# Patient Record
Sex: Female | Born: 1985 | Race: White | Hispanic: Refuse to answer | Marital: Single
Health system: Northeastern US, Academic
[De-identification: ages and names within clinical notes are randomized; demographics above are authoritative.]

---

## 2016-03-06 ENCOUNTER — Ambulatory Visit: Admitting: Hand Surgery

## 2016-03-06 NOTE — Progress Notes (Signed)
* * *        **  [name], [name]**    --- ---    65 Y old Female, DOB: 1985-09-20    [address], [place], [place] [address]    Home: [phone]    Provider: [name]        * * *    Telephone Encounter    ---    Answered by   Archer Asa  Date: 03/06/2016         Time: 01:55 PM    Caller   [name]    --- ---            Reason   Second opinion            Message                      Good afternoon,      The patient is having surgery next week on both hands and would like to get a second opinion from Dr. Rodman Pickle before. Please call the patient back at [phone].            Thank you=)                Action Taken   Allen,Kara 03/06/2016 1:57:18 PM > [name],[name] 03/06/2016  2:48:01 PM > , Action - Pt telephoned. Spoke to pt.                * * *                ---          * * *          Patient: [name], [name] DOB: 1985-08-20 Provider: [name] 03/06/2016    ---    Note generated by eClinicalWorks EMR/PM Software ([email or web address])

## 2016-03-15 ENCOUNTER — Ambulatory Visit: Admitting: Hand Surgery

## 2016-03-15 ENCOUNTER — Ambulatory Visit: Admitting: Surgical

## 2016-03-15 NOTE — Progress Notes (Signed)
* * *        **Flood, Loriann**    --- ---    69 Y old Female, DOB: [birth year], External MRN: [id number]    Account Number: [id number]    [address] [place], [place]    Home: [phone]    Insurance: HMO BLUE OUT [employer]    PCP: Greg Cutter, MD Referring: Greg Cutter, MD    Appointment Facility: [facility]        * * *    03/15/2016   **Appointment Provider:** Herbert Deaner, [facility] **CHN#:** [phone]    --- ---      **Supervising Provider:** [name], MD    ---        Reason for Appointment    ---      1\. Scleroderma    ---      History of Present Illness    ---     _GENERAL_ :    The patient is a 30 year old female with past medical history significant for  scleroderma with painful subcutaneous calcium deposition in her digits and her  left elbow. She is treated currently with IVIG. With regards to her left  elbow, she has had excision of her calcium deposition approximately 4 years  ago with recurrence. She also notes excision of calcium deposition at her  right middle fingertip 2 times once about 1 year ago and previously about 4  years ago, again with recurrence. She also notes a painful deposition at her  right ring fingertip and her left small fingertip. At this point, her fingers  are her primary complaint that she would prefer to address first. She denies  any issues with healing in the past. She does do hyperbaric oxygen as well.  She also mentions a painful right dorsal wrist nodule.      Current Medications    ---    Taking     * IVIg 500 mg 1 tab Oral 2x a month    ---    * Vitamin B Complex - Tablet Orally     ---    * Vitamin C Tablet Oral     ---    * Vitamin D 2000 U 1 tab every day    ---    * WP Thyroid 32.5 MG Tablet 1 tablet on an empty stomach Orally Once a day    ---    * Medication List reviewed and reconciled with the patient    ---      Past Medical History    ---       Medical History Verified.Marland Kitchen        ---      Surgical History    ---      Hand  surgery-left elbow- rt hand calcium excision    ---      Family History    ---       : unknown    ---    No autoimmune.    ---      Social History    ---    Tobacco  history: Never smoked.    Marital Status  Single.    Work/Occupation: full-time Consulting civil engineer.    Alcohol  Yes but rarely.      Allergies    ---      Keflex: Allergy    ---    Cephalexin    ---      Hospitalization/Major Diagnostic Procedure    ---  No Hospitalization History.    ---      Review of Systems    ---     _ORT_ :    Eyes No. Ear, Nose Throat No. Digestion, Stomach, Bowel No. Bladder Problems  No. Bleeding Problems No. Numbness/Tingling No. Anxiety/Depression No.  Fever/Chills/Fatigue No. Chest Pain/Tightness/Palpitations No. Skin Rash No.  Dental Problems No. Joint/Muscle Pain/Cramps Yes. Blackout/Fainting No. Other  No.          Vital Signs    ---    Pain scale 10, Ht-in 5'5, Wt-lbs 130.      Examination    ---     _GENERAL_ :    On examination today, she is a well-appearing pleasant female in no apparent  distress. On examination of her left elbow, she has obvious deformity noted.  She has range of motion from approximately negative 35 degrees full extension  with near full flexion and full rotation. With regards to her hand, she has  limited digital range of motion both lacking full extension as well as full  flexion. She has relatively well maintained MP range of motion. She has a  large calcium deposit at the tip of her right middle finger and clinically the  nail appears curved. She has smaller deposits evident at the tip of the right  ring finger and the left small finger. She also has a nodule on the dorsal  ulnar aspect of her right wrist.    RADIOGRAPHS: Radiographs that the patient brought with her of her bilateral  hands are reviewed by [place] today. These are dated 03/2016. We also obtained an  x-ray of her right middle finger today. These demonstrate a large calcium  deposit at the tip of the right middle finger with minimal bone  remaining  supporting the nail. She also has a deposition at the level of her volar PIP  joint of the right middle finger. There is smaller calcium depositions at the  tip of her left small finger and right ring finger as well as her right dorsal  wrist.          Assessments    ---    1\. Scleroderma - M34.9 (Primary)    ---      Treatment    ---       **1\. Others**    Notes: The patient was seen today with Dr. Rodman Pickle. Again, she is interested  in excision of calcium deposition. 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 She also would like to think about possible distal  amputation of her right middle finger. She will meet with Chiquita Loth today  about the possibility of prosthesis for her right middle finger. She will then  make a decision as to whether she would like to proceed with amputation versus  excision of calcification. She will be scheduled for surgery at her  convenience. The risks and benefits of surgery were discussed.    ---      Follow Up    ---    In OR    **Appointment Provider:** Herbert Deaner, [facility]    Electronically signed by [name] , MD on 10/03/2016 at 06:17 PM EDT    Sign off status: Completed        * * *        [facility]    [address]  [place], [place] [address]    Tel: [phone]    Fax: [phone]              * * *          Patient: EMILIE, CARP DOB: 1986-02-01 Progress Note: Herbert Deaner, [facility]  03/15/2016    ---    Note generated by eClinicalWorks EMR/PM Software ([email or web address])

## 2016-03-15 NOTE — Progress Notes (Signed)
.  Progress Notes  .  Patient: [name]  Provider: Herbert Deaner    .  DOB: [birth year] Age: 30 Y Sex: Female  Supervising Provider:: [name], MD  Date: 03/15/2016  .  PCP: Greg Cutter MD  Date: 03/15/2016  .  --------------------------------------------------------------------------------  .  REASON FOR APPOINTMENT  .  1. Scleroderma  .  HISTORY OF PRESENT ILLNESS  .  GENERAL:  The patient is a 30 year old female with  past medical history significant for scleroderma with painful  subcutaneous calcium deposition in her digits and her left elbow.  She is treated currently with IVIG. With regards to her left  elbow, she has had excision of her calcium deposition  approximately 4 years ago with recurrence. She also notes  excision of calcium deposition at her right middle fingertip 2  times once about 1 year ago and previously about 4 years ago,  again with recurrence. She also notes a painful deposition at her  right ring fingertip and her left small fingertip. At this point,  her fingers are her primary complaint that she would prefer to  address first. She denies any issues with healing in the past.  She does do hyperbaric oxygen as well. She also mentions a  painful right dorsal wrist nodule.  Marland Kitchen  CURRENT MEDICATIONS  .  Taking IVIg 500 mg 1 tab Oral 2x a month  Taking Vitamin B Complex - Tablet Orally  Taking Vitamin C Tablet Oral  Taking Vitamin D 2000 U 1 tab every day  Taking [facility] Thyroid 32.5 MG Tablet 1 tablet on an empty stomach  Orally Once a day  Medication List reviewed and reconciled with the patient  .  PAST MEDICAL HISTORY  .  Medical History Verified.  .  ALLERGIES  .  Keflex: Allergy  Cephalexin  .  SURGICAL HISTORY  .  Hand surgery-left elbow- rt hand calcium excision  .  FAMILY HISTORY  .  : unknown  No autoimmune.  .  SOCIAL HISTORY  .  .  Tobaccohistory:Never smoked.  .  Marital Status Single.  .  Work/Occupation: full-time Consulting civil engineer.  .  Alcohol Yes but  rarely.  Marland Kitchen  HOSPITALIZATION/MAJOR DIAGNOSTIC PROCEDURE  .  No Hospitalization History.  Marland Kitchen  REVIEW OF SYSTEMS  .  ORT:  .  Eyes    No . Ear, Nose Throat    No . Digestion, Stomach, Bowel     No . Bladder Problems    No . Bleeding Problems    No .  Numbness/Tingling    No . Anxiety/Depression    No .  Fever/Chills/Fatigue    No . Chest Pain/Tightness/Palpitations     No . Skin Rash    No . Dental Problems    No . Joint/Muscle  Pain/Cramps    Yes . Blackout/Fainting    No . Other    No .  .  VITAL SIGNS  .  Pain scale 10, Ht-in 5'5, Wt-lbs 130.  Marland Kitchen  EXAMINATION  .  GENERAL: On examination today, she is a  well-appearing pleasant female in no apparent distress. On  examination of her left elbow, she has obvious deformity noted.  She has range of motion from approximately negative 35 degrees  full extension with near full flexion and full rotation. With  regards to her hand, she has limited digital range of motion both  lacking full extension as well as full flexion. She has  relatively well maintained MP range of  motion. She has a large  calcium deposit at the tip of her right middle finger and  clinically the nail appears curved. She has smaller deposits  evident at the tip of the right ring finger and the left small  finger. She also has a nodule on the dorsal ulnar aspect of her  right wrist.RADIOGRAPHS: Radiographs that the patient brought  with her of her bilateral hands are reviewed by [place] today. These  are dated 03/2016. We also obtained an x-ray of her right middle  finger today. These demonstrate a large calcium deposit at the  tip of the right middle finger with minimal bone remaining  supporting the nail. She also has a deposition at the level of  her volar PIP joint of the right middle finger. There is smaller  calcium depositions at the tip of her left small finger and right  ring finger as well as her right dorsal wrist.  .  ASSESSMENTS  .  Scleroderma - M34.9 (Primary)  .  TREATMENT  .  Others  Notes: The  patient was seen today with Dr. Rodman Pickle. Again, she is  interested in excision of calcium deposition. 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 She also  would like to think about possible distal amputation of her right  middle finger. She will meet with Chiquita Loth today about the  possibility of prosthesis for her right middle finger. She will  then make a decision as to whether she would like to proceed with  amputation versus excision of calcification. She will be  scheduled for surgery at her convenience. The risks and benefits  of surgery were discussed.  .  FOLLOW UP  .  In OR  .  Marland Kitchen  Appointment Provider: Herbert Deaner, [facility]  .  Electronically signed by [name] , MD on  10/03/2016 at 06:17 PM EDT  .  CONFIRMATORY SIGN OFF  .  Marland Kitchen  Document electronically signed by Herbert Deaner    .

## 2016-03-22 ENCOUNTER — Ambulatory Visit

## 2016-03-22 ENCOUNTER — Ambulatory Visit: Admitting: Hand Surgery

## 2016-03-22 NOTE — Progress Notes (Signed)
* * *        **[name], [name]**    --- ---    26 Y old Female, DOB: 22-Jul-1985, External MRN: [id number]    Account Number: [id number]    [address] [place], [place]    Home: [phone]    Insurance: HMO BLUE OUT [employer]    PCP: Greg Cutter, MD Referring: Greg Cutter, MD    Appointment Facility: [facility]        * * *    03/22/2016   **Appointment Provider:** Crist Infante **CHN#:** [id number]    --- ---      **Supervising Provider:** [name], MD    ---        Reason for Appointment    ---      1\. Scleroderma    ---      History of Present Illness    ---     _GENERAL_ :    The patient is a 30 year old female with past medical history significant for  scleroderma with painful subcutaneous calcium deposition in her digits and her  left elbow. She is here to discuss the imaging results before her planned  surgery 12/28.      Current Medications    ---    Taking     * IVIg 800 mg infusion Oral 2x a month    ---    * Vitamin B Complex - Tablet Orally     ---    * Vitamin C Tablet Oral     ---    * Vitamin D 2000 U 1 tab every day    ---    * WP Thyroid 32.5 MG Tablet 1 tablet on an empty stomach Orally Once a day    ---      Past Medical History    ---       Medical History Verified.Marland Kitchen        ---      Surgical History    ---      Hand surgery-left elbow- rt hand calcium excision    ---      Family History    ---       : unknown    ---    No autoimmune.    ---      Social History    ---    Tobacco    history: _Never smoked_    Marital Status    _Single_    Work/Occupation: full-time Consulting civil engineer.    Alcohol    _Yes but rarely_       Allergies    ---      Keflex: Allergy    ---    Cephalexin    ---      Hospitalization/Major Diagnostic Procedure    ---      No Hospitalization History.    ---      Review of Systems    ---     _ORT_ :    Eyes No. Ear, Nose Throat No. Digestion, Stomach, Bowel No. Bladder Problems  No. Bleeding Problems No. Numbness/Tingling No.  Anxiety/Depression No.  Fever/Chills/Fatigue No. Chest Pain/Tightness/Palpitations No. Skin Rash No.  Dental Problems No. Joint/Muscle Pain/Cramps Yes. Blackout/Fainting No. Other  No.          Vital Signs    ---    Pain scale 10, Ht-in 5'5.      Examination    ---     _GENERAL_ :  On examination today, she is a well-appearing pleasant female in no apparent  distress. On examination of her left elbow, she has obvious deformity noted.  She has range of motion from approximately negative 35 degrees full extension  with near full flexion and full rotation. With regards to her hand, she has  limited digital range of motion both lacking full extension as well as full  flexion. She has relatively well maintained MP range of motion. She has a  large calcium deposit at the tip of her right middle finger and clinically the  nail appears curved. She has smaller deposits evident at the tip of the right  ring finger and the left small finger. She also has a nodule on the dorsal  ulnar aspect of her right wrist.    RADIOGRAPHS: Radiographs that the patient brought with her of her bilateral  hands are reviewed by [place] today. These are dated 03/2016. We also obtained an  x-ray of her right middle finger today. These demonstrate a large calcium  deposit at the tip of the right middle finger with minimal bone remaining  supporting the nail. She also has a deposition at the level of her volar PIP  joint of the right middle finger. There is smaller calcium depositions at the  tip of her left small finger and right ring finger as well as her right dorsal  wrist.          Assessments    ---    1\. Scleroderma - M34.9 (Primary)    ---      Treatment    ---       **1\. Others**    Notes: 30yo planned for excision of calcific masses of R MF/RF, dorsal wrist  and L elbow. Dr. Rodman Pickle has explained treatment options and she wants to  proceed with excision of 3 masses on R hand: R MF/RF and dorsal wrist. We  discussed that she could undergo  MF DIP fusion after she heals from this  surgery, likely 6 months. She wants tramadol post op.    ---      Follow Up    ---    post op    **Appointment Provider:** [facility]    Electronically signed by [name] , MD on 03/23/2016 at 11:09 PM EST    Sign off status: Completed        * * *        [facility]    [address]    [place], [place] [address]    Tel: [phone]    Fax: [phone]              * * *          Patient: [name], [name] DOB: [birth year] Progress Note: [name]  03/22/2016    ---    Note generated by eClinicalWorks EMR/PM Software ([email or web address])

## 2016-03-22 NOTE — Progress Notes (Signed)
.  Progress Notes  .  Patient: [name]  Provider: Crist Infante  MD  .  DOB: [birth year] Age: 30 Y Sex: Female  Supervising Provider:: [name], MD  Date: 03/22/2016  .  PCP: Greg Cutter MD  Date: 03/22/2016  .  --------------------------------------------------------------------------------  .  REASON FOR APPOINTMENT  .  1. Scleroderma  .  HISTORY OF PRESENT ILLNESS  .  GENERAL:  The patient is a 30 year old female with  past medical history significant for scleroderma with painful  subcutaneous calcium deposition in her digits and her left elbow.  She is here to discuss the imaging results before her planned  surgery 12/28.  Marland Kitchen  CURRENT MEDICATIONS  .  Taking IVIg 800 mg infusion Oral 2x a month  Taking Vitamin B Complex - Tablet Orally  Taking Vitamin C Tablet Oral  Taking Vitamin D 2000 U 1 tab every day  Taking [facility] Thyroid 32.5 MG Tablet 1 tablet on an empty stomach  Orally Once a day  .  PAST MEDICAL HISTORY  .  Medical History Verified.  .  ALLERGIES  .  Keflex: Allergy  Cephalexin  .  SURGICAL HISTORY  .  Hand surgery-left elbow- rt hand calcium excision  .  FAMILY HISTORY  .  : unknown  No autoimmune.  .  SOCIAL HISTORY  .  .  Tobacco  history:Never smoked  .  Marland Kitchen  Marital Status  Single  .  Marland Kitchen  Work/Occupation: full-time Consulting civil engineer.  .  .  Alcohol  Yes but rarely  .  HOSPITALIZATION/MAJOR DIAGNOSTIC PROCEDURE  .  No Hospitalization History.  Marland Kitchen  REVIEW OF SYSTEMS  .  ORT:  .  Eyes    No . Ear, Nose Throat    No . Digestion, Stomach, Bowel     No . Bladder Problems    No . Bleeding Problems    No .  Numbness/Tingling    No . Anxiety/Depression    No .  Fever/Chills/Fatigue    No . Chest Pain/Tightness/Palpitations     No . Skin Rash    No . Dental Problems    No . Joint/Muscle  Pain/Cramps    Yes . Blackout/Fainting    No . Other    No .  .  VITAL SIGNS  .  Pain scale 10, Ht-in 5'5.  Marland Kitchen  EXAMINATION  .  GENERAL: On examination today, she is a  well-appearing pleasant female in no apparent  distress. On  examination of her left elbow, she has obvious deformity noted.  She has range of motion from approximately negative 35 degrees  full extension with near full flexion and full rotation. With  regards to her hand, she has limited digital range of motion both  lacking full extension as well as full flexion. She has  relatively well maintained MP range of motion. She has a large  calcium deposit at the tip of her right middle finger and  clinically the nail appears curved. She has smaller deposits  evident at the tip of the right ring finger and the left small  finger. She also has a nodule on the dorsal ulnar aspect of her  right wrist.RADIOGRAPHS: Radiographs that the patient brought  with her of her bilateral hands are reviewed by [place] today. These  are dated 03/2016. We also obtained an x-ray of her right middle  finger today. These demonstrate a large calcium deposit at the  tip of the right middle finger with minimal  bone remaining  supporting the nail. She also has a deposition at the level of  her volar PIP joint of the right middle finger. There is smaller  calcium depositions at the tip of her left small finger and right  ring finger as well as her right dorsal wrist.  .  ASSESSMENTS  .  Scleroderma - M34.9 (Primary)  .  TREATMENT  .  Others  Notes: 30yo planned for excision of calcific masses of R MF/RF,  dorsal wrist and L elbow. Dr. Rodman Pickle has explained treatment  options and she wants to proceed with excision of 3 masses on R  hand: R MF/RF and dorsal wrist. We discussed that she could  undergo MF DIP fusion after she heals from this surgery, likely 6  months. She wants tramadol post op.  .  FOLLOW UP  .  post op  .  Marland Kitchen  Appointment Provider: Crist Infante  .  Electronically signed by [name] , MD on  03/23/2016 at 11:09 PM EST  .  CONFIRMATORY SIGN OFF  .  Marland Kitchen  Document electronically signed by Crist Infante  MD  .

## 2016-03-28 ENCOUNTER — Ambulatory Visit: Admitting: Hand Surgery

## 2016-03-28 LAB — HX BF-URINALYSIS: HX URINE PREGNANCY TEST (HCG QUAL): NEGATIVE

## 2016-08-15 ENCOUNTER — Ambulatory Visit: Admitting: Hand Surgery

## 2016-08-15 NOTE — Progress Notes (Signed)
* * *        **  [name], [name]**    --- ---    41 Y old Female, DOB: Aug 20, 1985    [address], [place], [place] [address]    Home: [phone]    Provider: [name]        * * *    Telephone Encounter    ---    Answered by   Park Liter  Date: 08/15/2016         Time: 02:11 PM    Caller   Ceclia    --- ---            Reason   Call Back            Message                      Hi -      Patient is requesting a call back from Dr. Delsa Grana coordinator because she has a few questions before scheduling an appointment. Please call back at [phone].            Thank you.                Action Taken   [facility] 08/15/2016 2:14:11 PM > I called [name] back. I  left her a VM to call the office back. [name],[name] 08/19/2016 2:33:24 PM >                * * *                ---          * * *          Patient: [name], [name] DOB: May 24, 1985 Provider: [name] 08/15/2016    ---    Note generated by eClinicalWorks EMR/PM Software ([email or web address])

## 2016-09-26 ENCOUNTER — Ambulatory Visit: Admitting: Hand Surgery

## 2016-09-26 NOTE — Progress Notes (Signed)
* * *        **  [name], [name]**    --- ---    48 Y old Female, DOB: 10-Oct-1985    [address] [place], [place] [address]    Home: [phone]    Provider: [name]        * * *    Telephone Encounter    ---    Answered by   Archer Asa  Date: 09/26/2016         Time: 10:15 AM    Caller   Adriene    --- ---            Reason   Call back            Message                      Good morning,      The patient is following up regarding Dr. Rodman Pickle doing  surgery on her elbow and rt hand. Please call the patient back at [phone]                      Action Taken   Allen,Kara 09/26/2016 10:15:41 AM > [name],[name] 09/30/2016  9:08:03 AM > , Action - pt telephoned. Left message Douzable,Cafiane 09/30/2016  10:00:12 AM > Patient is requesting a call back. Please contact patient at  phone:[phone]. Thank you She called back [name],[name] 09/30/2016  1:07:33 PM > [name],[name] 10/01/2016 9:00:40 AM > Called back. Message on  recording says "person can not accept calls at this time. Unable to leave  message.                * * *                ---          * * *          Patient: [name], [name] DOB: 1985/11/25 Provider: [name] 09/26/2016    ---    Note generated by eClinicalWorks EMR/PM Software ([email or web address])

## 2017-02-24 ENCOUNTER — Ambulatory Visit: Admitting: Hand Surgery

## 2017-02-24 ENCOUNTER — Ambulatory Visit: Admitting: Orthopaedic Surgery

## 2017-02-24 NOTE — Progress Notes (Signed)
.  Progress Notes  .  Patient: [name], [name]  Provider: Dorian Pod    .  DOB: [birth year] Age: 31 Y Sex: Female  Supervising Provider:: [name], MD  Date: 02/24/2017  .  PCP: Greg Cutter MD  Date: 02/24/2017  .  --------------------------------------------------------------------------------  .  REASON FOR APPOINTMENT  .  1. LT ELBOW WOUND FU  .  HISTORY OF PRESENT ILLNESS  .  GENERAL:   Ms. Kalmar is a pleasant 31 year old female with past medical  history of scleroderma with painful subcutaneous calcium  deposition of her left elbow, presents for evaluation of a left  elbow wound. She reports while in [place], a subcutaneous calcium  deposition broke through the skin and had surgery done to remove  the calcium deposition and wound closure. She believes the  sutures were removed prematurely causing the wound to open. New  sutures were placed, however, it did not fully close the wound.  She reports of continued mild drainage from the wound, as  serosanguineous in nature rather than purulent discharge. She  denies fevers. She endorses numbness and paresthesias of the left  hand more noticeably on the middle finger. She denies trauma or  any other issues at this time.  .  CURRENT MEDICATIONS  .  Taking IVIg 800 mg infusion Oral 2x a month  Taking Vitamin B Complex - Tablet Orally  Taking Vitamin C Tablet Oral  Taking Vitamin D 2000 U 1 tab every day  Taking [facility] Thyroid 32.5 MG Tablet 1 tablet on an empty stomach  Orally Once a day  Medication List reviewed and reconciled with the patient  .  PAST MEDICAL HISTORY  .  Medical History Verified.  .  ALLERGIES  .  Keflex: Allergy  Cephalexin  .  SURGICAL HISTORY  .  Hand surgery-left elbow- rt hand calcium excision  .  FAMILY HISTORY  .  : unknown  No autoimmune.  .  SOCIAL HISTORY  .  .  Tobaccohistory:Never smoked.  .  Marital Status Single.  .  Work/Occupation: full-time Consulting civil engineer.  .  Alcohol Yes but rarely.  Marland Kitchen  HOSPITALIZATION/MAJOR DIAGNOSTIC  PROCEDURE  .  No Hospitalization History.  Marland Kitchen  REVIEW OF SYSTEMS  .  ORT:  .  Eyes    No . Ear, Nose Throat    No . Digestion, Stomach, Bowel     No . Bladder Problems    No . Bleeding Problems    No .  Numbness/Tingling    No . Anxiety/Depression    No .  Fever/Chills/Fatigue    No . Chest Pain/Tightness/Palpitations     No . Skin Rash    No . Dental Problems    No . Joint/Muscle  Pain/Cramps    Yes . Blackout/Fainting    No . Other    No .  .  VITAL SIGNS  .  Pain scale 7, Ht-in 5'5, Ht-cm 152.4.  .  PHYSICAL EXAMINATION  .  30yo female, well-appearing, in no acute distress. On Examination  of the left elbow reveals skin iindurated. There is a 2cm wound  posterolateral aspect of the elbow, with signs of minimal  drainage. There is NO erythema, edema. There is subcuteneous  calcification surrounging the wound. Mild Tenderness surrounding  the wound. ROM: lacks 30 deg of full extension, has full flexion,  able to pronosupinate. Positive Tinels and wrist compression  tests. Fires EDC/FDS/FDP/EPL/FPL. Neurologic sensation intact to  light touch in M/U/R distributions. Hand is warm  and well  perfused with palpable radial pulse.IMAGING: Xrays of left elbow  reviewed by [place] today reveal no acute fracture or dislocation.  There is progressive clustered calcification deposition posterior  of elbow.  .  ASSESSMENTS  .  Open wound of left elbow, subsequent encounter - S51.002D  (Primary)  .  Scleroderma - M34.9 (Primary)  .  TREATMENT  .  Open wound of left elbow, subsequent encounter  Notes: The patient was seen and evaluated with Dr. Rodman Pickle. A  31 year old female presents with open surgical wound with minimal  discharge secondary to excision of a subcutaneous calcium  deposition. On exam, there is a 2 cm wound with minimal clear  discharge and imaging revealed large amount of calcium deposits  most noticeably on the posterior aspect of the elbow. Findings,  diagnosis, and nature of the conditions were discussed with  the  patient again. We also had a long discussion regarding treatment  options including conservative and more aggressive measurements.  We recommend surgical intervention with a radical calcium  deposition excision. After discussing risks, benefits, and  alternatives answering all questions, the patient was agreeable  to proceed with surgery. Risks include, but were not limited to  infection, pain, stiffness, neurovascular injury, recurrence,  wound healing problems, and the need for further surgery. The  anticipated postoperative rehabilitation was reviewed with the  patient as well. Our administrator will meet with the patient to  schedule a surgical date. She voices agreement with the  assessment and plan. Questions were answered.  .  FOLLOW UP  .  surgery  .  Marland Kitchen  Appointment Provider: Dorian Pod  .  Electronically signed by [name] , MD on  03/16/2017 at 06:58 PM EST  .  CONFIRMATORY SIGN OFF  .  Marland Kitchen  Document electronically signed by Dorian Pod    .

## 2017-02-24 NOTE — Progress Notes (Signed)
* * *        **Lundstrom, [name]**    --- ---    61 Y old Female, DOB: 04-07-1985, External MRN: [id number]    Account Number: [id number]    [address] [id number]    Home: [phone]    Insurance: HMO BLUE OUT IPA    PCP: Greg Cutter, MD Referring: Greg Cutter, MD    [facility]        * * *    02/24/2017   **Appointment Provider:** Dorian Pod **CHN#:** [id number]    --- ---      **Supervising Provider:** [name], MD    ---        Reason for Appointment    ---      1\. LT ELBOW WOUND FU    ---      History of Present Illness    ---     _GENERAL_ :    Ms. Soberanes is a pleasant 31 year old female with past medical history of  scleroderma with painful subcutaneous calcium deposition of her left elbow,  presents for evaluation of a left elbow wound. She reports while in [place], a  subcutaneous calcium deposition broke through the skin and had surgery done to  remove the calcium deposition and wound closure. She believes the sutures were  removed prematurely causing the wound to open. New sutures were placed,  however, it did not fully close the wound. She reports of continued mild  drainage from the wound, as serosanguineous in nature rather than purulent  discharge. She denies fevers. She endorses numbness and paresthesias of the  left hand more noticeably on the middle finger. She denies trauma or any other  issues at this time.      Current Medications    ---    Taking     * IVIg 800 mg infusion Oral 2x a month    ---    * Vitamin B Complex - Tablet Orally     ---    * Vitamin C Tablet Oral     ---    * Vitamin D 2000 U 1 tab every day    ---    * WP Thyroid 32.5 MG Tablet 1 tablet on an empty stomach Orally Once a day    ---    * Medication List reviewed and reconciled with the patient    ---      Past Medical History    ---       Medical History Verified.Marland Kitchen        ---      Surgical History    ---      Hand surgery-left elbow- rt hand  calcium excision    ---      Family History    ---       : unknown    ---    No autoimmune.    ---      Social History    ---    Tobacco  history: Never smoked.    Marital Status  Single.    Work/Occupation: full-time Consulting civil engineer.    Alcohol  Yes but rarely.      Allergies    ---      Keflex: Allergy    ---    Cephalexin    ---      Hospitalization/Major Diagnostic Procedure    ---      No Hospitalization History.    ---  Review of Systems    ---     _ORT_ :    Eyes No. Ear, Nose Throat No. Digestion, Stomach, Bowel No. Bladder Problems  No. Bleeding Problems No. Numbness/Tingling No. Anxiety/Depression No.  Fever/Chills/Fatigue No. Chest Pain/Tightness/Palpitations No. Skin Rash No.  Dental Problems No. Joint/Muscle Pain/Cramps Yes. Blackout/Fainting No. Other  No.          Vital Signs    ---    Pain scale 7, Ht-in 5'5, Ht-cm 152.4.      Physical Examination    ---    30yo female, well-appearing, in no acute distress. On Examination of the left  elbow reveals skin iindurated. There is a 2cm wound posterolateral aspect of  the elbow, with signs of minimal drainage. There is NO erythema, edema. There  is subcuteneous calcification surrounging the wound. Mild Tenderness  surrounding the wound. ROM: lacks 30 deg of full extension, has full flexion,  able to pronosupinate. Positive Tinels and wrist compression tests. Fires  EDC/FDS/FDP/EPL/FPL. Neurologic sensation intact to light touch in M/U/R  distributions. Hand is warm and well perfused with palpable radial pulse.    IMAGING: Xrays of left elbow reviewed by [place] today reveal no acute fracture or  dislocation. There is progressive clustered calcification deposition posterior  of elbow.      Assessments    ---    1\. Open wound of left elbow, subsequent encounter - S51.002D (Primary)    ---    2\. Scleroderma - M34.9 (Primary)    ---      Treatment    ---       **1\. Open wound of left elbow, subsequent encounter**    Notes: The patient was seen and evaluated with Dr.  Rodman Pickle. A 31 year old  female presents with open surgical wound with minimal discharge secondary to  excision of a subcutaneous calcium deposition. On exam, there is a 2 cm wound  with minimal clear discharge and imaging revealed large amount of calcium  deposits most noticeably on the posterior aspect of the elbow. Findings,  diagnosis, and nature of the conditions were discussed with the patient again.  We also had a long discussion regarding treatment options including  conservative and more aggressive measurements. We recommend surgical  intervention with a radical calcium deposition excision. After discussing  risks, benefits, and alternatives answering all questions, the patient was  agreeable to proceed with surgery. Risks include, but were not limited to  infection, pain, stiffness, neurovascular injury, recurrence, wound healing  problems, and the need for further surgery. The anticipated postoperative  rehabilitation was reviewed with the patient as well. Our administrator will  meet with the patient to schedule a surgical date. She voices agreement with  the assessment and plan. Questions were answered.    ---      Follow Up    ---    surgery    **Appointment Provider:** Dorian Pod    Electronically signed by [name] , MD on 03/16/2017 at 06:58 PM EST    Sign off status: Completed        * * *        [facility]    [address]    [place], [place] [address]    Tel: [phone]    Fax: [phone]              * * *          Patient: AIRIS, BARBEE DOB: [birth year] Progress Note: Dorian Pod  02/24/2017    ---    Note generated by eClinicalWorks EMR/PM Software ([email or web address])

## 2017-04-30 LAB — BMP (EXT)
BUN (EXT): 7 mg/dL (ref 7–25)
CO2 (EXT): 23 mmol/L (ref 21–33)
Chloride (EXT): 104 mmol/L (ref 98–110)
Creatinine (EXT): 0.7 mg/dL (ref 0.50–1.20)
Glucose (EXT): 89 mg/dL (ref 60–99)
Potassium (EXT): 4 mmol/L (ref 3.3–5.3)
Sodium (EXT): 142 mmol/L (ref 134–146)
eGFR - Creat MDRD (EXT): >60 (ref >60)
eGFR - Creat MDRD (EXT): >60 (ref >60)

## 2017-04-30 LAB — UNMAPPED LAB RESULTS: CalciumCalcium (EXT): 9.2 mg/dL (ref 8.8–10.6)

## 2017-05-13 ENCOUNTER — Ambulatory Visit: Admitting: Hand Surgery

## 2017-05-13 NOTE — Progress Notes (Signed)
* * *        **  [name], [name]**    --- ---    12 Y old Female, DOB: October 20, 1985    [address], [place], [place] [address]    Home: [phone]    Provider: [name]        * * *    Telephone Encounter    ---    Answered by   Gwynne Edinger  Date: 05/13/2017         Time: 02:48 PM    Caller   Patient    --- ---            Reason   Email-Callback            Message                      Hello,            Pt would like to know if she can have work done on her elbow and her hand at the same time instead of coming in for 2 seperate surgerys. Please email the pt back [email or web address] or contact at the number on file.            Thank you.                Action Taken                      Crocker,Anthony  05/13/2017 2:51:09 PM >                     * * *                ---          * * *          Patient: [name], [name] DOB: Sep 26, 1985 Provider: [name] 05/13/2017    ---    Note generated by eClinicalWorks EMR/PM Software ([email or web address])

## 2017-05-22 ENCOUNTER — Ambulatory Visit: Admitting: Hand Surgery

## 2017-05-22 NOTE — Progress Notes (Signed)
* * *        **  [name], [name]**    --- ---    54 Y old Female, DOB: 06-Sep-1985    [address], [place], [place] [address]    Home: [phone]    Provider: [name]        * * *    Telephone Encounter    ---    Answered by   Gwynne Edinger  Date: 05/22/2017         Time: 10:21 AM    Caller   Patient    --- ---            Reason   Callback            Message                      Hello,            Pt has her CT scans available so she would like to schedule her surgery or at least see the doctor to see what he thinks about the scans but the pt cannot wait till the end of March to see the doctor. Please contact back at [phone].            Thank you.                Action Taken                      Crocker,Anthony  05/22/2017 10:25:09 AM >       [name],[name]  05/23/2017 10:24:08 AM > , Action - pt telephoned.  Left message      [name],[name]  05/26/2017 1:16:02 PM > , Action - Pt telephoned.  Spoke to pt. Scheduled appointment for 06/04/17                    * * *                ---          * * *          Patient: [name], [name] DOB: 01-Sep-1985 Provider: [name] 05/22/2017    ---    Note generated by eClinicalWorks EMR/PM Software ([email or web address])

## 2017-06-04 ENCOUNTER — Ambulatory Visit: Admitting: Hand Surgery

## 2017-06-04 NOTE — Progress Notes (Signed)
.  Progress Notes  .  Patient: [name], [name]  Provider: [name]    .  DOB: 11/05/1985 Age: 32 Y Sex: Female  .  PCP: Ihor Gully T MD  Date: 06/04/2017  .  --------------------------------------------------------------------------------  .  REASON FOR APPOINTMENT  .  1. Left elbow contracture, left hand cold  .  HISTORY OF PRESENT ILLNESS  .  GENERAL:   follow-up. she postoponed her surgery. the elbow wound is  smaller and has not gotten infected, but continues to drain a  bit. She would like to get the wound to heal and to improve her  extnesion. she also has had episodes of fingers becoming white  and cold intermittently. She saw Dr. Garnette Czech at the [facility] and  was apparently told that there was nothing wrong.  Marland Kitchen  CURRENT MEDICATIONS  .  Taking IVIg 800 mg infusion Oral 2x a month  Taking Vitamin B Complex - Tablet Orally  Taking Vitamin C Tablet Oral  Taking Vitamin D 2000 U 1 tab every day  Taking [facility] Thyroid 32.5 MG Tablet 1 tablet on an empty stomach  Orally Once a day  Medication List reviewed and reconciled with the patient  .  PAST MEDICAL HISTORY  .  Medical History Verified.  .  ALLERGIES  .  Keflex: Allergy  Cephalexin  .  SURGICAL HISTORY  .  Hand surgery-left elbow- rt hand calcium excision  .  FAMILY HISTORY  .  : unknown  No autoimmune.  .  SOCIAL HISTORY  .  .  Tobaccohistory:Never smoked.  .  Marital Status Single.  .  Work/Occupation: full-time Consulting civil engineer.  .  Alcohol Yes but rarely.  Marland Kitchen  HOSPITALIZATION/MAJOR DIAGNOSTIC PROCEDURE  .  No Hospitalization History.  Marland Kitchen  REVIEW OF SYSTEMS  .  ORT:  .  Eyes    No . Ear, Nose Throat    No . Digestion, Stomach, Bowel     No . Bladder Problems    No . Bleeding Problems    No .  Numbness/Tingling    No . Anxiety/Depression    No .  Fever/Chills/Fatigue    No . Chest Pain/Tightness/Palpitations     No . Skin Rash    No . Dental Problems    No . Joint/Muscle  Pain/Cramps    Yes . Blackout/Fainting    No . Other    No .  .  VITAL SIGNS  .  Pain scale  5, Ht-in 5'5, Ht-cm 152.4.  .  PHYSICAL EXAMINATION  .  she appears healthy. examination of her left elbow demonstrates a  2 cm linear wound with a white base along the old posterolateral  scar. no erythema. AROM=PROM -30/140, full rotation. the cubital  tunnel is somewhat obliterated. sensation intact to light  touch.somewhat delayed capillary refill in digits. Doppler  unavailable. radial pulse palpable. digital motion limited. skin  intact. CT of left elbow reviewed by me demonstrates extensive  calcium deposits around posterior aspect of elbow not extneding  anteriorly. also separate deposit along ulna more distally at  site of nodule. some obiteration of cubital tunnel.  .  ASSESSMENTS  .  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  Scleroderma - M34.9  .  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  TREATMENT  .  Contracture of left elbow  Notes: nature of left elbow problem reviewed with her. I do not  think that wound will heal without removal of the calcium  deposits, which are extensive. I explained that I would favor  radical resection of the calcium via open posterior approach,  which will lallow for closure of the wound. I explained that i  would not like to do an anterior capsulotomy at the same time  given the wound and potential infection risks. an arthroscopic  release could be done at a later date if necessary. With respect  to her hand, she has Raynaud's which is very common in  scleroderma. An MRA will be obtained once she is fully recovered  from the elbow. RIsks of surgery, including but not limited to  infeciton,nerve damage, stiffness, persistent or recurrent wound,  incomplete correction of contracture, persistent or recurrent  calcium depostis explained, she wishes to proceed. 23 hour  observation, splint and drain, initiate therapy on POD1.  .  FOLLOW UP  .  surgery  .  Electronically signed by [name] , MD on  06/07/2017 at 08:56 AM EST  .  Document electronically  signed by [name]    .

## 2017-06-04 NOTE — Progress Notes (Signed)
* * *        **Urbas, Meagan**    --- ---    83 Y old Female, DOB: 12-26-1985, External MRN: [id number]    Account Number: [id number]    [address] [id number]    Home: [phone]    Insurance: [facility] OUT IPA HMO    PCP: Talbert Forest, MD Referring: Talbert Forest, MD    Appointment Facility[facility]        * * *    06/04/2017  Progress Notes: [name], MD **CHN#:** [phone]    --- ---    ---         **Reason for Appointment**    ---       1\. Left elbow contracture, left hand cold    ---       **History of Present Illness**    ---     _GENERAL_ :    follow-up. she postoponed her surgery. the elbow wound is smaller and has not  gotten infected, but continues to drain a bit. She would like to get the wound  to heal and to improve her extnesion. she also has had episodes of fingers  becoming white and cold intermittently. She saw Dr. Garnette Czech at the [facility] and  was apparently told that there was nothing wrong.       **Current Medications**    ---    Taking     * IVIg 800 mg infusion Oral 2x a month    ---    * Vitamin B Complex - Tablet Orally     ---    * Vitamin C Tablet Oral     ---    * Vitamin D 2000 U 1 tab every day    ---    * WP Thyroid 32.5 MG Tablet 1 tablet on an empty stomach Orally Once a day    ---    * Medication List reviewed and reconciled with the patient    ---       **Past Medical History**    ---       Medical History Verified..        ---       **Surgical History**    ---       Hand surgery-left elbow- rt hand calcium excision    ---      **Family History**    ---       : unknown    ---    No autoimmune.    ---       **Social History**    ---    Tobacco  history: Never smoked.    Marital Status  Single.    Work/Occupation: full-time Consulting civil engineer.    Alcohol  Yes but rarely.      **Allergies**    ---       Keflex: Allergy    ---    Cephalexin    ---       **Hospitalization/Major Diagnostic Procedure**    ---       No Hospitalization History.    ---        **Review of Systems**    ---     _ORT_ :    Eyes No. Ear, Nose Throat No. Digestion, Stomach, Bowel No. Bladder Problems  No. Bleeding Problems No. Numbness/Tingling No. Anxiety/Depression No.  Fever/Chills/Fatigue No. Chest Pain/Tightness/Palpitations No. Skin Rash No.  Dental Problems No. Joint/Muscle Pain/Cramps Yes. Blackout/Fainting No. Other  No.          **Vital Signs**    ---    Pain scale 5, Ht-in 5'5, Ht-cm 152.4.       **Physical Examination**    ---    she appears healthy. examination of her left elbow demonstrates a 2 cm linear  wound with a white base along the old posterolateral scar. no erythema.  AROM=PROM -30/140, full rotation. the cubital tunnel is somewhat obliterated.  sensation intact to light touch.    somewhat delayed capillary refill in digits. Doppler unavailable. radial pulse  palpable. digital motion limited. skin intact. CT of left elbow reviewed by me  demonstrates extensive calcium deposits around posterior aspect of elbow not  extneding anteriorly. also separate deposit along ulna more distally at site  of nodule. some obiteration of cubital tunnel.       **Assessments**    ---    1\. 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 Scleroderma - M34.9    ---    3\. Raynaud''s phenomenon without gangrene - I73.00    ---       **Treatment**    ---       **1\. Contracture of left elbow**    Notes: nature of left elbow problem reviewed with her. I do not think that  wound will heal without removal of the calcium deposits, which are extensive.  I explained that I would favor radical resection of the calcium via open  posterior approach, which will lallow for closure of the wound. I explained  that i would not like to do an anterior capsulotomy at the same time given the  wound and potential infection risks. an arthroscopic release could be done at  a later date if necessary. With respect to her hand, she has Raynaud's which  is very common in scleroderma. An MRA will be  obtained once she is fully  recovered from the elbow. RIsks of surgery, including but not limited to  infeciton,nerve damage, stiffness, persistent or recurrent wound, incomplete  correction of contracture, persistent or recurrent calcium depostis explained,  she wishes to proceed. 23 hour observation, splint and drain, initiate therapy  on POD1.    ---      **Follow Up**    ---    surgery    Electronically signed by [name] , MD on 06/07/2017 at 08:56 AM EST    Sign off status: Completed        * * *        [facility]    [address]    [place], [address]    [place], [place] [address]    Tel: [phone]    Fax: [phone]              * * *          Patient: MAXWELL, MARTORANO DOB: [birth year] Progress Note: [name], MD  06/04/2017    ---    Note generated by eClinicalWorks EMR/PM Software ([email or web address])

## 2017-06-09 ENCOUNTER — Ambulatory Visit: Admitting: Otolaryngology

## 2017-06-09 NOTE — Progress Notes (Signed)
* * *        **  Picone, Maguire**    --- ---    36 Y old Female, DOB: Nov 06, 1985    [address] [place], [place] [address]    Home: [phone]    Provider: Armando Gang        * * *    Telephone Encounter    ---    Answered by   Neldon Mc  Date: 06/09/2017         Time: 04:33 PM    Caller   [name]    --- ---            Reason   medical advice            Message                      Patient calling looking to schedule an appointment with Dr. Nedra Hai regarding [place]" and is now c/o discoloration to the tip of her nose that started 3 days ago w/ numbness presenting today. Consulted with PA and informed patient she should go to her nearest emergency department, they can order any necessary imaging tests, patient will f/u with me after ER visit. Patient is aware Dr. Nedra Hai is out of the office until the week of 06/16/2017.                 Action Taken                      Roberts,Kwana  06/09/2017 4:39:27 PM >                     * * *                ---          * * *          Patient: Wence, Dakisha DOB: 04-12-1985 Provider: Armando Gang 06/09/2017    ---    Note generated by eClinicalWorks EMR/PM Software ([email or web address])

## 2017-06-30 LAB — CHLAMYDIA/GC (EXT)
Chlamydia trachomatis (EXT): NEGATIVE
Neisseria gonorrhoeae (EXT): NEGATIVE

## 2017-07-14 ENCOUNTER — Ambulatory Visit: Admitting: Hand Surgery

## 2017-07-14 ENCOUNTER — Ambulatory Visit

## 2017-07-14 NOTE — Progress Notes (Signed)
* * *        **[name], [name]**    --- ---    32 Y old Female, DOB: 05/05/1985, External MRN: [id number]    Account Number: [id number]    [address] [id number]    Home: [phone]    Insurance: [facility] [employer] HMO    PCP: Talbert Forest, MD Referring: Talbert Forest, MD    Appointment Facility: [facility]        * * *    07/14/2017   **Appointment Provider:** Lorenda Ishihara, MD **CHN#:** [id number]    --- ---      **Supervising Provider:** [name], MD    ---         **Reason for Appointment**    ---       1\. Scleroderma    ---    2\. Left elbow contracture with calcification deposits    ---       **History of Present Illness**    ---     _GENERAL_ :    [name] returns to clinic today for follow-up. She postponed her surgery because  she was traveling to [place] and [place]. She was unable to tolerate the  cold in [place] during the wintertime given her scleroderma and severe  Raynaud's phenomenon. During her travels, she unfortunately contracted UTI and  was started on doxycycline and Macrobid which caused a flareup of the  calcifications in her left elbow with some increased drainage. She was very  diligent about her wound care and she has managed to keep her left elbow wound  from getting infected. With regard to her symptoms, she continues to have  significant limitation with her left elbow extension, and is also concerned  about her likelihood of wound healing. She also notes right wrist dorsal  calcification as well as volar middle finger calcification on the right hand,  which she would like to address in the future after her left elbow surgery.  She would like to proceed with a left elbow surgery as soon as possible at  this time.       **Current Medications**    ---    Taking     * doxycycline 100MG      ---    * IVIg 800 mg infusion Oral 2x a month    ---    * Macrobid 100 MG Capsule 1 capsule with food Orally every 12 hrs    ---    * Vitamin B Complex - Tablet Orally      ---    * Vitamin C Tablet Oral     ---    * Vitamin D 2000 U 1 tab every day    ---    * WP Thyroid 32.5 MG Tablet 1 tablet on an empty stomach Orally Once a day    ---       **Past Medical History**    ---       Medical History Verified..        ---       **Surgical History**    ---       Hand surgery-left elbow- rt hand calcium excision    ---      **Family History**    ---       : unknown    ---    No autoimmune.    ---       **Social History**    ---    Tobacco  history: _Never smoked_    Marital Status    _Single_    Work/Occupation: Physicist, medical.    Alcohol    _Yes but rarely_       **Allergies**    ---       Keflex: Allergy    ---    Cephalexin    ---       **Hospitalization/Major Diagnostic Procedure**    ---       No Hospitalization History.    ---       **Review of Systems**    ---     _ORT_ :    Eyes No. Ear, Nose Throat No. Digestion, Stomach, Bowel No. Bladder Problems  No. Bleeding Problems No. Numbness/Tingling No. Anxiety/Depression No.  Fever/Chills/Fatigue No. Chest Pain/Tightness/Palpitations No. Skin Rash No.  Dental Problems No. Joint/Muscle Pain/Cramps Yes. Blackout/Fainting No. Other  No.          **Vital Signs**    ---    Pain scale 7, Ht-in 5'5, Ht-cm 152.4.       **Physical Examination**    ---    She is a well-appearing woman in no acute distress. Examination of her left  elbow demonstrates a linear surgical incision with a 5 mm opening with a wide  base and very scant drainage of thick white discharge. There is no surrounding  erythema or significant swelling. Elbow active range of motion is from 30-140  degrees of flexion and full pronosupination. Distally, her sensation is intact  to light touch in the median, radial and ulnar distributions. She does have  delayed capillary refill in the digits, but they are all warm and pink.  Digital motion is limited throughout.    Examination of the right wrist with dorsal ulnar nodule which is approximately  1 cm diameter, nontender.  Middle finger with hard nodule at the volar aspect  of the PIP joint which is somewhat tender and limiting her motion. She also  has a fixed contracture of the DIP joint of the middle finger.       **Assessments**    ---    1\. 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 Scleroderma - M34.9    ---       **Treatment**    ---       **1\. Others**    Notes: [name] is a 32 year old woman with scleroderma and a left elbow  contracture with significant calcium deposition posteriorly. We have planned  for a radical resection of the calcifications through an open posterior  approach. We did explain to her that once these have been removed, there will  be some redundancy of the skin, which will allow [place] to close the wound over  the small opening without the need for any skin graft. The surgery will be  scheduled for the next 1-2 weeks pending availability. With regard to her  right wrist and hand, we will plan to address these after she has recovered  from her left elbow surgery. Following her elbow surgery, she will have a  drain placed in the wound with a splint, which will be removed on postop day  1. She will be admitted for 23-hour observation and will begin therapy on the  day after surgery.    ---      **Follow Up**    ---    for surgery    **Appointment Provider:** Lorenda Ishihara, MD    Electronically signed by [name] ,  MD on 07/21/2017 at 08:09 AM EDT    Sign off status: Completed        * * *        [facility]    [address]    [place]    [place], [place] [address]    Tel: [phone]    Fax: [phone]              * * *          Patient: [name], [name] DOB: 08-18-1985 Progress Note: Lorenda Ishihara, MD  07/14/2017    ---    Note generated by eClinicalWorks EMR/PM Software ([email or web address])

## 2017-07-14 NOTE — Progress Notes (Signed)
.  Progress Notes  .  Patient: [name]  Provider: Lorenda Ishihara  MD  .  DOB: [birth year] Age: 32 Y Sex: Female  Supervising Provider:: [name], MD  Date: 07/14/2017  .  PCP: Ihor Gully T MD  Date: 07/14/2017  .  --------------------------------------------------------------------------------  .  REASON FOR APPOINTMENT  .  1. Scleroderma  .  2. Left elbow contracture with calcification deposits  .  HISTORY OF PRESENT ILLNESS  .  GENERAL:  Maripat returns to clinic today for  follow-up. She postponed her surgery because she was traveling to  [place] and [place]. She was unable to tolerate the cold in  [place] during the wintertime given her scleroderma and severe  Raynaud's phenomenon. During her travels, she unfortunately  contracted UTI and was started on doxycycline and Macrobid which  caused a flareup of the calcifications in her left elbow with  some increased drainage. She was very diligent about her wound  care and she has managed to keep her left elbow wound from  getting infected. With regard to her symptoms, she continues to  have significant limitation with her left elbow extension, and is  also concerned about her likelihood of wound healing. She also  notes right wrist dorsal calcification as well as volar middle  finger calcification on the right hand, which she would like to  address in the future after her left elbow surgery. She would  like to proceed with a left elbow surgery as soon as possible at  this time.  .  CURRENT MEDICATIONS  .  Taking doxycycline 100MG   Taking IVIg 800 mg infusion Oral 2x a month  Taking Macrobid 100 MG Capsule 1 capsule with food Orally every  12 hrs  Taking Vitamin B Complex - Tablet Orally  Taking Vitamin C Tablet Oral  Taking Vitamin D 2000 U 1 tab every day  Taking [facility] Thyroid 32.5 MG Tablet 1 tablet on an empty stomach  Orally Once a day  .  PAST MEDICAL HISTORY  .  Medical History Verified.  .  ALLERGIES  .  Keflex: Allergy  Cephalexin  .  SURGICAL  HISTORY  .  Hand surgery-left elbow- rt hand calcium excision  .  FAMILY HISTORY  .  : unknown  No autoimmune.  .  SOCIAL HISTORY  .  .  Tobacco  history:Never smoked  .  Marland Kitchen  Marital Status  Single  .  Marland Kitchen  Work/Occupation: full-time Consulting civil engineer.  .  .  Alcohol  Yes but rarely  .  HOSPITALIZATION/MAJOR DIAGNOSTIC PROCEDURE  .  No Hospitalization History.  Marland Kitchen  REVIEW OF SYSTEMS  .  ORT:  .  Eyes    No . Ear, Nose Throat    No . Digestion, Stomach, Bowel     No . Bladder Problems    No . Bleeding Problems    No .  Numbness/Tingling    No . Anxiety/Depression    No .  Fever/Chills/Fatigue    No . Chest Pain/Tightness/Palpitations     No . Skin Rash    No . Dental Problems    No . Joint/Muscle  Pain/Cramps    Yes . Blackout/Fainting    No . Other    No .  .  VITAL SIGNS  .  Pain scale 7, Ht-in 5'5, Ht-cm 152.4.  .  PHYSICAL EXAMINATION  .  She is a well-appearing woman in no acute distress. Examination  of her left elbow demonstrates a linear surgical incision  with a  5 mm opening with a wide base and very scant drainage of thick  white discharge. There is no surrounding erythema or significant  swelling. Elbow active range of motion is from 30-140 degrees of  flexion and full pronosupination. Distally, her sensation is  intact to light touch in the median, radial and ulnar  distributions. She does have delayed capillary refill in the  digits, but they are all warm and pink. Digital motion is limited  throughout.Examination of the right wrist with dorsal ulnar  nodule which is approximately 1 cm diameter, nontender. Middle  finger with hard nodule at the volar aspect of the PIP joint  which is somewhat tender and limiting her motion. She also has a  fixed contracture of the DIP joint of the middle finger.  .  ASSESSMENTS  .  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  Scleroderma - M34.9  .  TREATMENT  .  Others  Notes: Chasitty is a 32 year old woman with scleroderma and a left  elbow contracture with significant calcium  deposition  posteriorly. We have planned for a radical resection of the  calcifications through an open posterior approach. We did explain  to her that once these have been removed, there will be some  redundancy of the skin, which will allow [place] to close the wound  over the small opening without the need for any skin graft. The  surgery will be scheduled for the next 1-2 weeks pending  availability. With regard to her right wrist and hand, we will  plan to address these after she has recovered from her left elbow  surgery. Following her elbow surgery, she will have a drain  placed in the wound with a splint, which will be removed on  postop day 1. She will be admitted for 23-hour observation and  will begin therapy on the day after surgery.  .  FOLLOW UP  .  for surgery  .  Marland Kitchen  Appointment Provider: Lorenda Ishihara, MD  .  Electronically signed by [name] , MD on  07/21/2017 at 08:09 AM EDT  .  CONFIRMATORY SIGN OFF  .  Marland Kitchen  Document electronically signed by Lorenda Ishihara  MD  .

## 2017-07-24 ENCOUNTER — Ambulatory Visit: Admitting: Hand Surgery

## 2017-07-24 LAB — HX BF-URINALYSIS: HX URINE PREGNANCY TEST (HCG QUAL): NEGATIVE

## 2017-07-24 NOTE — Op Note (Signed)
Patient    [name], [name]              Med Rec #:  [id number]  Name:  Operation  07/24/2017                Pt.  Dt:                                  Location:  .  Marland Kitchen                               OPERATIVE REPORT  .  Marland Kitchen  PREOPERATIVE DIAGNOSES:  1.  Scleroderma with extensive calcinosis, left elbow.  2.  Left elbow contracture.  Marland Kitchen  POSTOPERATIVE DIAGNOSES:  1.  Scleroderma with extensive calcinosis, left elbow.  2.  Left elbow contracture.  Marland Kitchen  PROCEDURE:  Radical resection, heterotopic bone and capsule, left elbow.  .  SURGEON:  [name], MD  .  ASSISTANTS:  1.  Lorenda Ishihara, MD  2.  Bonita Quin, MD  .  ANESTHESIA:  General.  .  COMPLICATION:  None.  .  ESTIMATED BLOOD LOSS:  20 mL.  Marland Kitchen  DRAIN:  One Hemovac.  Marland Kitchen  SPECIMEN:  To pathology.  .  TOURNIQUET TIME:  Less than 2 hours.  .  COMPLICATIONS:  None.  .  INDICATIONS:  She has scleroderma with diffuse calcinosis around her left  elbow resulting in left elbow contracture.  She has had intermittent  drainage from the wound posterolaterally at a previous surgical suite where  apparently a small amount of the calcinosis was excised.  A CT scan  demonstrates calcinosis diffusely in the posterior compartment of the elbow  as well as posterolaterally.  She also has another area of calcinosis more  distally along the ulna that she would like to have it excised at the same  time.  Risks of surgery including, but not limited to infection, nerve  damage, stiffness, recurrent calcinosis or heterotopic ossification, wound  healing problems, and incomplete symptom relief were explained to the  patient preoperatively who wished to proceed.  .  DESCRIPTION OF THE PROCEDURE:  Following adequate anesthesia, the patient  was placed in the right lateral decubitus position on the operating table  and held in place using a beanbag.  An axillary roll was placed.  The  fibular head was relieved.  The left upper extremity was prepped and draped  in sterile fashion and placed into a  well-padded elbow holder.  The limb  was exsanguinated using an Esmarch and a proximal sterile arm tourniquet  elevated to 230 mmHg.  The area that had been draining had closed.  This  former sinus tract was excised in an elliptical fashion and the incision  was carried through the preexisting surgical scar and extended proximally,  posterolaterally and distally to connect with an old scar along the  subcutaneous border of the ulna.  Flaps were elevated at the level of  calcinosis.  There was a line of calcinosis anterior to the skin incision  adherent to the deep dermis and this line of calcinosis was excised  sharply.  Portions of the calcinosis were superficial to the fascia and  were excised.  Dissection was carried deeper with care to protect the  lateral collateral ligament.  The calcinosis involved the majority of the  triceps insertion with not  many triceps fibers remaining incontinuity with  the ulna.  Care was taken to protect these residual fibers.  The  heterotopic bone was excised in the posterior compartment and along the  posterior aspect of the humerus and the posterolateral and the posterior  radiocapitellar space with care to protect the lateral collateral ligament.  The tricep was retracted and the capsule posteriorly was excised.  There  was calcinosis medially as noted by the CT scan in the region of the  cubital tunnel.  Consequently, the triceps was retracted laterally and the  ulnar nerve was identified proximal to the medial epicondyle.  The ulnar  nerve was protected and a calcinosis adjacent to the medial epicondyle and  the cubital tunnel was excised, protecting the ulnar nerve.  There was some  calcinosis anterior adherent to the flexor pronator origin, which was  debrided using a curette.  Fluoroscopy confirmed that the vast majority of  the calcinosis had been excised.  Following this excision, passive elbow  extension was full.  The tourniquet was released.  Hemostasis was  achieved  with pressure followed by bipolar electrocautery.  The wound was irrigated  copiously after the second area of calcinosis along the subcutaneous border  of the ulna had been excised.  Hemovac drain was placed.  The lateral edge  of the triceps was approximated to the anconeus fascia and common extensor  origin and posterior aspect of the lateral column using 0 Vicryl  figure-of-eight interrupted sutures.  The flaps were intact to the fascia  using 2-0 Vicryl interrupted sutures.  The subcutaneous tissue was  approximated with 3-0 Vicryl interrupted sutures and the skin with 3-0  nylon simple and horizontal mattress interrupted sutures.  Sterile dressing  and an anterior plaster splint were applied with the elbow in full  extension.  The patient tolerated the procedure well and was discharged to  the recovery room in good condition.  .  .  .  Electronically Signed  [name], MD 07/27/2017 09:04 P  .  .  Marland Kitchen  Dictated byYetta Numbers, MD  .  D:    07/26/2017  T:    07/26/2017 10:11 A  Dictation ID:  [id number]/Doc#  [id number]  .  cc:  .  Marland Kitchen      Document is preliminary until electronically or manually signed by                             attending physician.

## 2017-07-25 ENCOUNTER — Ambulatory Visit

## 2017-07-30 LAB — HEPATITIS C ANTIBODY (EXT): HEPATITIS C ANTIBODY (EXT): <0.02 {index_val} (ref <0.80)

## 2017-07-30 LAB — LIPID PROFILE (EXT)
Chol/HDL Ratio (EXT): 4.3 (ref <=4.9)
Cholesterol (EXT): 181 mg/dL (ref <=199)
HDL Cholesterol (EXT): 42 mg/dL (ref >=41)
LDL Cholesterol, CALC (EXT): 110.8 mg/dL (ref <=130)
Triglycerides (EXT): 141 mg/dL (ref <=149)

## 2017-07-31 LAB — HX COLONOSCOPY

## 2017-07-31 LAB — HX SURGICAL

## 2017-08-01 ENCOUNTER — Ambulatory Visit: Admitting: Hand Surgery

## 2017-08-01 ENCOUNTER — Ambulatory Visit

## 2017-08-01 NOTE — Progress Notes (Signed)
.  Progress Notes  .  Patient: [name], [name]  Provider: RAMSDEN, DAVID    .  DOB: Dec 09, 1985 Age: 32 Y Sex: Female  Supervising Provider:: [name], MD  Date: 08/01/2017  .  PCP: Md Cathrine Muster  MD  Date: 08/01/2017  .  --------------------------------------------------------------------------------  .  REASON FOR APPOINTMENT  .  1. f/u s/p radical resection of heterotopic bone left elbow, DOS  07/24/17  .  HISTORY OF PRESENT ILLNESS  .  GENERAL:  Rozalyn presents today for her first  followup visit status post radical resection of heterotopic bone  from the left elbow. She reports that she has been doing very  well. She saw occupational therapy for thermoplast splint  fabrication. She does report the development of a rash in the  area of some Tegaderm following splint removal, but otherwise  denies any issues with her incision. She denies any drainage from  her incision. She denies any numbness or tingling in the  extremity. Denies any fevers or chills. Her pain is overall very  well controlled and she is not taking any medications for pain at  this point. She is very happy with her range of motion and  already feels it is much improved compared to preoperatively.  .  CURRENT MEDICATIONS  .  Taking doxycycline 100MG   Taking IVIg 800 mg infusion Oral 2x a month  Taking Macrobid 100 MG Capsule 1 capsule with food Orally every  12 hrs  Taking Vitamin B Complex - Tablet Orally  Taking Vitamin C Tablet Oral  Taking Vitamin D 2000 U 1 tab every day  Taking [facility] Thyroid 32.5 MG Tablet 1 tablet on an empty stomach  Orally Once a day  .  PAST MEDICAL HISTORY  .  Medical History Verified.  .  ALLERGIES  .  Keflex: Allergy  Cephalexin  .  SURGICAL HISTORY  .  Hand surgery-left elbow- rt hand calcium excision  Radical resection, heterotopic bone and capsule, left elbow  07/24/2017  .  FAMILY HISTORY  .  : unknown  No autoimmune.  .  SOCIAL HISTORY  .  .  Tobaccohistory:Never smoked.  .  Marital Status  Single.  .  Work/Occupation: full-time Consulting civil engineer.  .  Alcohol Yes but rarely.  Marland Kitchen  HOSPITALIZATION/MAJOR DIAGNOSTIC PROCEDURE  .  No Hospitalization History.  Marland Kitchen  REVIEW OF SYSTEMS  .  ORT:  .  Eyes    No . Ear, Nose Throat    No . Digestion, Stomach, Bowel     No . Bladder Problems    No . Bleeding Problems    No .  Numbness/Tingling    No . Anxiety/Depression    No .  Fever/Chills/Fatigue    No . Chest Pain/Tightness/Palpitations     No . Skin Rash    No . Dental Problems    No . Joint/Muscle  Pain/Cramps    Yes . Blackout/Fainting    No . Other    No .  .  VITAL SIGNS  .  Pain scale 5, Ht-in 5'5, Ht-cm 152.4.  .  PHYSICAL EXAMINATION  .  Well appearing and in no acute distress. On examination of the  left upper extremity, the posterior elbow incision is healing  well without any surrounding erythema or drainage. Nylon sutures  remain in place. Elbow range of motion is much improved compared  to preoperatively. She has a range of motion arc from  approximately 20 degrees to 110 degrees. Pronation and supination  are nearly symmetric to the contralateral side. Distally,  sensation is intact in the radial, median, and ulnar  distributions. She fires AIN, PIN, and ulnar motor groups. The  hand is warm and well perfused with brisk capillary  refill.IMAGING: X-rays of the left elbow were obtained and  reviewed today. These demonstrate marked decrease in the presence  of heterotopic bone in comparison to prior studies. There are no  fractures or dislocations appreciated.  .  ASSESSMENTS  .  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  Scleroderma - M34.9  .  Heterotopic ossification - M89.8X9  .  TREATMENT  .  Others  Notes: The patient was seen and evaluated with Dr. Rodman Pickle.  Overall, Ms. Penning is doing very well now approximately 1 week  out from radical resection of heterotopic bone from the left  elbow. She may continue with activity and range of motion as  tolerated in regards to the left elbow; however, she  should  continue to avoid any heavy lifting. She should also continue to  keep her incision clean and dry. She should continue working with  occupational therapy and with her nighttime brace wear. We will  plan to see her back in approximately 1 week for suture removal.  All questions were answered and Donnice was in full agreement with  the plan.  .  FOLLOW UP  .  1 Week  .  Marland Kitchen  Appointment Provider: DAVID RAMSDEN  .  Electronically signed by [name] , MD on  08/18/2017 at 08:08 AM EDT  .  CONFIRMATORY SIGN OFF  .  Marland Kitchen  Document electronically signed by RAMSDEN, DAVID    .

## 2017-08-01 NOTE — Progress Notes (Signed)
* * *        **[name], [name]**    --- ---    68 Y old Female, DOB: [birth year], External MRN: [id number]    Account Number: [id number]    [address] [name]    Home: [phone]    Insurance: [employer]    PCP: [name], MD Referring: [name], MD    Appointment Facility: [facility]        * * *    08/01/2017   **Appointment Provider:** Tilton Marsalis **CHN#:** [id number]    --- ---      **Supervising Provider:** [name], MD    ---         **Reason for Appointment**    ---       1\. 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 She reports that she has been doing  very well. She saw occupational therapy for thermoplast splint fabrication.  She does report the development of a rash in the area of some Tegaderm  following splint removal, but otherwise denies any issues with her incision.  She denies any drainage from her incision. She denies any numbness or tingling  in the extremity. Denies any fevers or chills. Her pain is overall very well  controlled and she is not taking any medications for pain at this point. She  is very happy with her range of motion and already feels it is much improved  compared to preoperatively.       **Current Medications**    ---    Taking     * doxycycline 100MG      ---    * IVIg 800 mg infusion Oral 2x a month    ---    * Macrobid 100 MG Capsule 1 capsule with food Orally every 12 hrs    ---    * Vitamin B Complex - Tablet Orally     ---    * Vitamin C Tablet Oral     ---    * Vitamin D 2000 U 1 tab every day    ---    * WP Thyroid 32.5 MG Tablet 1 tablet on an empty stomach Orally Once a day    ---       **Past Medical History**    ---       Medical History Verified..        ---       **Surgical History**     ---       Hand surgery-left elbow- rt hand calcium excision    ---    Radical resection, heterotopic bone and capsule, left elbow 07/24/2017    ---       **Family History**    ---       : unknown    ---    No autoimmune.    ---       **Social History**    ---    Tobacco  history: Never smoked.    Marital Status  Single.    Work/Occupation: full-time Consulting civil engineer.    Alcohol  Yes but rarely.      **Allergies**    ---  Keflex: Allergy    ---    Cephalexin    ---       **Hospitalization/Major Diagnostic Procedure**    ---       No Hospitalization History.    ---       **Review of Systems**    ---     _ORT_ :    Eyes No. Ear, Nose Throat No. Digestion, Stomach, Bowel No. Bladder Problems  No. Bleeding Problems No. Numbness/Tingling No. Anxiety/Depression No.  Fever/Chills/Fatigue No. Chest Pain/Tightness/Palpitations No. Skin Rash No.  Dental Problems No. Joint/Muscle Pain/Cramps Yes. Blackout/Fainting No. Other  No.          **Vital Signs**    ---    Pain scale 5, Ht-in 5'5, Ht-cm 152.4.       **Physical Examination**    ---    Well appearing and in no acute distress. On examination of the left upper  extremity, the posterior elbow incision is healing well without any  surrounding erythema or drainage. Nylon sutures remain in place. Elbow range  of motion is much improved compared to preoperatively. She has a range of  motion arc from approximately 20 degrees to 110 degrees. Pronation and  supination are nearly symmetric to the contralateral side. Distally, sensation  is intact in the radial, median, and ulnar distributions. She fires AIN, PIN,  and ulnar motor groups. The hand is warm and well perfused with brisk  capillary refill.    IMAGING: X-rays of the left elbow were obtained and reviewed today. These  demonstrate marked decrease in the presence of heterotopic bone in comparison  to prior studies. There are no fractures or dislocations appreciated.       **Assessments**    ---    1\. 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 Scleroderma - M34.9    ---    3\. 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 Others**    Notes: The patient was seen and evaluated with Dr. Rodman Pickle. Overall, Ms.  [name] is doing very well now approximately 1 week out from radical resection  of heterotopic bone from the left elbow. She may continue with activity and  range of motion as tolerated in regards to the left elbow; however, she should  continue to avoid any heavy lifting. She should also continue to keep her  incision clean and dry. She should continue working with occupational therapy  and with her nighttime brace wear. We will plan to see her back in  approximately 1 week for suture removal. All questions were answered and [name]  was in full agreement with the plan.    ---      **Follow Up**    ---    1 Week    **Appointment Provider:** Dajana Gehrig    Electronically signed by [name] , MD on 08/18/2017 at 08:08 AM EDT    Sign off status: Completed        * * *        [facility]    [address]    [place], [address]    [place], [place] [address]    Tel: [phone]    Fax: [phone]              * * *  Patient: [name], [name] DOB: Apr 11, 1985 Progress Note: [name]  08/01/2017    ---    Note generated by eClinicalWorks EMR/PM Software ([email or web address])

## 2017-08-11 ENCOUNTER — Ambulatory Visit: Admitting: Hand Surgery

## 2017-08-11 ENCOUNTER — Ambulatory Visit

## 2017-08-11 NOTE — Progress Notes (Signed)
.  Progress Notes  .  Patient: [name]  Provider: Arlyss Repress  MD  .  DOB: [birth year] Age: 32 Y Sex: Female  Supervising Provider:: [name], MD  Date: 08/11/2017  .  PCP: Md Cathrine Muster  MD  Date: 08/11/2017  .  --------------------------------------------------------------------------------  .  REASON FOR APPOINTMENT  .  1. f/u s/p radical resection of heterotopic bone left elbow, DOS  07/24/17  .  HISTORY OF PRESENT ILLNESS  .  GENERAL:  The patient is seen for evaluation s/p  the above procedure. Pain has been improving. No new  paresthesias. No interval medical problems are noted. Here for  suture removal.  .  CURRENT MEDICATIONS  .  Taking doxycycline 100MG   Taking IVIg 800 mg infusion Oral 2x a month  Taking Macrobid 100 MG Capsule 1 capsule with food Orally every  12 hrs  Taking Vitamin B Complex - Tablet Orally  Taking Vitamin C Tablet Oral  Taking Vitamin D 2000 U 1 tab every day  Taking [facility] Thyroid 32.5 MG Tablet 1 tablet on an empty stomach  Orally Once a day  .  PAST MEDICAL HISTORY  .  Medical History Verified.  .  ALLERGIES  .  Keflex: Allergy  Cephalexin  .  SURGICAL HISTORY  .  Hand surgery-left elbow- rt hand calcium excision  Radical resection, heterotopic bone and capsule, left elbow  07/24/2017  .  FAMILY HISTORY  .  : unknown  No autoimmune.  .  SOCIAL HISTORY  .  .  Tobacco  history:Never smoked  .  Marland Kitchen  Marital Status  Single  .  Marland Kitchen  Work/Occupation: full-time Consulting civil engineer.  .  .  Alcohol  Yes but rarely  .  HOSPITALIZATION/MAJOR DIAGNOSTIC PROCEDURE  .  No Hospitalization History.  Marland Kitchen  REVIEW OF SYSTEMS  .  ORT:  .  Eyes    No . Ear, Nose Throat    No . Digestion, Stomach, Bowel     No . Bladder Problems    No . Bleeding Problems    No .  Numbness/Tingling    No . Anxiety/Depression    No .  Fever/Chills/Fatigue    No . Chest Pain/Tightness/Palpitations     No . Skin Rash    No . Dental Problems    No . Joint/Muscle  Pain/Cramps    Yes . Blackout/Fainting    No . Other    No  .  .  VITAL SIGNS  .  Pain scale 0, Ht-in 5'5, Ht-cm 152.4.  .  PHYSICAL EXAMINATION  .  Well appearing and in no acute distress. On examination of the  left upper extremity, the posterior elbow incision Incision  healing well with sutures in-place. Clean, dry, intact. No  erythema or drainage., Elbow range of motion is much improved  compared to preoperatively. Pronation and supination are nearly  symmetric to the contralateral side. Distally, sensation is  intact in the radial, median, and ulnar distributions. She fires  AIN, PIN, and ulnar motor groups. The hand is warm and well  perfused with brisk capillary refill.  .  ASSESSMENTS  .  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  Scleroderma - M34.9  .  Contracture of left elbow - M24.522  .  TREATMENT  .  Heterotopic ossification  Notes: The diagnosis and treatment plan was discussed with the  patient at this visit. All questions were answered to their  apparent satisfaction and they expressed an understanding and  agreement with the plan. Her sutures were removed, she will keep  working with OT and use her night splint.  .  FOLLOW UP  .  6 Weeks  .  Marland Kitchen  Appointment Provider: Arlyss Repress, MD  .  Electronically signed by [name] , MD on  08/18/2017 at 08:08 AM EDT  .  CONFIRMATORY SIGN OFF  .  Marland Kitchen  Document electronically signed by Arlyss Repress  MD  .

## 2017-08-11 NOTE — Progress Notes (Signed)
* * *        **[name], [name]**    --- ---    49 Y old Female, DOB: [birth year], External MRN: [id number]    Account Number: [id number]    [address], [name]    Home: [phone]    Insurance: [employer]    PCP: [name], MD Referring: [name], MD    Appointment Facility: [facility]        * * *    08/11/2017   **Appointment Provider:** Arlyss Repress, MD **CHN#:** [phone]    --- ---      **Supervising Provider:** [name], MD    ---         **Reason for Appointment**    ---       1\. f/u s/p radical resection of heterotopic bone left elbow, DOS 07/24/17    ---       **History of Present Illness**    ---     _GENERAL_ :    The patient is seen for evaluation s/p the above procedure. Pain has been  improving. No new paresthesias. No interval medical problems are noted. Here  for suture removal.       **Current Medications**    ---    Taking     * doxycycline 100MG      ---    * IVIg 800 mg infusion Oral 2x a month    ---    * Macrobid 100 MG Capsule 1 capsule with food Orally every 12 hrs    ---    * Vitamin B Complex - Tablet Orally     ---    * Vitamin C Tablet Oral     ---    * Vitamin D 2000 U 1 tab every day    ---    * WP Thyroid 32.5 MG Tablet 1 tablet on an empty stomach Orally Once a day    ---       **Past Medical History**    ---       Medical History Verified..        ---       **Surgical History**    ---       Hand surgery-left elbow- rt hand calcium excision    ---    Radical resection, heterotopic bone and capsule, left elbow 07/24/2017    ---       **Family History**    ---       : unknown    ---    No autoimmune.    ---       **Social History**    ---    Tobacco    history: _Never smoked_    Marital Status    _Single_    Work/Occupation: full-time Consulting civil engineer.    Alcohol    _Yes but rarely_       **Allergies**    ---       Keflex: Allergy    ---    Cephalexin    ---       **Hospitalization/Major Diagnostic Procedure**    ---       No  Hospitalization History.    ---       **Review of Systems**    ---     _ORT_ :    Eyes No. Ear, Nose Throat No. Digestion, Stomach, Bowel No. Bladder Problems  No. Bleeding Problems No. Numbness/Tingling No. Anxiety/Depression No.  Fever/Chills/Fatigue No.  Chest Pain/Tightness/Palpitations No. Skin Rash No.  Dental Problems No. Joint/Muscle Pain/Cramps Yes. Blackout/Fainting No. Other  No.          **Vital Signs**    ---    Pain scale 0, Ht-in 5'5, Ht-cm 152.4.       **Physical Examination**    ---    Well appearing and in no acute distress. On examination of the left upper  extremity, the posterior elbow incision Incision healing well with sutures in-  place.    Clean, dry, intact. No erythema or drainage.,    Elbow range of motion is much improved compared to preoperatively. Pronation  and supination are nearly symmetric to the contralateral side. Distally,  sensation is intact in the radial, median, and ulnar distributions. She fires  AIN, PIN, and ulnar motor groups. The hand is warm and well perfused with  brisk capillary refill.       **Assessments**    ---    1\. 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 Scleroderma - M34.9    ---    3\. 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 Heterotopic ossification**    Notes: The diagnosis and treatment plan was discussed with the patient at this  visit. All questions were answered to their apparent satisfaction and they  expressed an understanding and agreement with the plan. Her sutures were  removed, she will keep working with OT and use her night splint.    ---      **Follow Up**    ---    6 Weeks    **Appointment Provider:** Arlyss Repress, MD    Electronically signed by [name] , MD on 08/18/2017 at 08:08 AM EDT    Sign off status: Completed        * * *        [facility]    [address]    [place], [address]    [place], [place] [address]    Tel: [phone]    Fax: [phone]               * * *          Patient: [name], [name] DOB: 19-Nov-1985 Progress Note: Arlyss Repress, MD  08/11/2017    ---    Note generated by eClinicalWorks EMR/PM Software ([email or web address])

## 2017-09-05 ENCOUNTER — Ambulatory Visit: Admitting: Hand Surgery

## 2017-09-05 NOTE — Progress Notes (Signed)
* * *        **  [name], [name]**    --- ---    50 Y old Female, DOB: Oct 02, 1985    [address] [place], [place] [address]    Home: [phone]    Provider: [name]        * * *    Telephone Encounter    ---    Answered by   Wilburt Finlay  Date: 09/05/2017         Time: 09:07 AM    Caller   [name], pt    --- ---            Reason   Wrist Pain            Message                      Morning,             [name] is experiencing some excruciating pain in her RT wrist is looking to see if she can come in today to be evaluated by Dr. Rodman Pickle. She states that she had surgery sometime around the end of March and due to the painful wrist, she is not able to any daily activities. Currently unable to move her hand. She is going to Urgent Care but she knows that she is going to be advised to see her Hand Specialist. [name] can be reached at, [phone].             Thank you!                 Action Taken                      Gilles,Guerbie  09/05/2017 9:12:49 AM >       [name],[name]  09/05/2017 10:34:44 AM > , Action - Pt telephoned.  Spoke to pt.                    * * *                ---          * * *          Patient: [name], [name] DOB: 1986-01-17 Provider: [name] 09/05/2017    ---    Note generated by eClinicalWorks EMR/PM Software ([email or web address])

## 2017-09-10 ENCOUNTER — Ambulatory Visit: Admitting: Hand Surgery

## 2017-09-10 LAB — HX CHEM-PANELS
HX BLOOD UREA NITROGEN: 8 mg/dL (ref 6–24)
HX CREATININE (CR): 0.72 mg/dL (ref 0.57–1.30)
HX GFR, AFRICAN AMERICAN: 127 mL/min/{1.73_m2}
HX GFR, NON-AFRICAN AMERICAN: 110 mL/min/{1.73_m2}

## 2017-09-10 NOTE — Progress Notes (Signed)
* * *        **[name], [name]**    --- ---    42 Y old Female, DOB: 1985-04-12, External MRN: [id number]    Account Number: [id number]    [address] [name]    Home: [phone]    Insurance: [employer]    PCP: [name], MD Referring: [name], MD    [facility]ointment Facility: [facility]        * * *    09/10/2017  Progress Notes: [name], MD **CHN#:** [phone]    --- ---    ---         **Reason for Appointment**    ---       1\. R WRIST PAIN    ---    2\. Poor circulation bilateral hands    ---       **History of Present Illness**    ---     _GENERAL_ :    doing well with respect to elbow. developed right wrist swelling and pain was  seen elsewhere, had negative aspiration for infection. pain has been  improving. motion limited but improving. most worried about bilateral hand  circulation. intermittent bluish discoloration of digits, worse on the right,  especially when in cold environments. also worried about tip of middle finger  which feels very tight.       **Current Medications**    ---    Taking     * doxycycline 100MG      ---    * IVIg 800 mg infusion Oral 2x a month    ---    * Macrobid 100 MG Capsule 1 capsule with food Orally every 12 hrs    ---    * Vitamin B Complex - Tablet Orally     ---    * Vitamin C Tablet Oral     ---    * Vitamin D 2000 U 1 tab every day    ---    * WP Thyroid 32.5 MG Tablet 1 tablet on an empty stomach Orally Once a day    ---    * Medication List reviewed and reconciled with the patient    ---       **Past Medical History**    ---       Medical History Verified..        ---       **Surgical History**    ---       Hand surgery-left elbow- rt hand calcium excision    ---    Radical resection, heterotopic bone and capsule, left elbow 07/24/2017    ---       **Family History**    ---       : unknown    ---    No autoimmune.    ---       **Social History**    ---    Work/Occupation: full-time Consulting civil engineer.    Alcohol  Yes  but rarely.    Marital Status  Single.    Tobacco  history: Never smoked.      **Allergies**    ---       Keflex: Allergy    ---    Cephalexin    ---       **Hospitalization/Major Diagnostic Procedure**    ---       Denies Past Hospitalization    ---       **Review of Systems**    ---  _ORT_ :    Eyes No. Ear, Nose Throat No. Digestion, Stomach, Bowel No. Bladder Problems  No. Bleeding Problems No. Numbness/Tingling No. Anxiety/Depression No.  Fever/Chills/Fatigue No. Chest Pain/Tightness/Palpitations No. Skin Rash No.  Dental Problems No. Joint/Muscle Pain/Cramps No. Blackout/Fainting No. Other  No.          **Vital Signs**    ---    Pain scale 6, Ht-in 5'5, Ht-cm 152.4.       **Physical Examination**    ---    appears healthy. left elbow incision well healed, ROM -15/140. right wrist:  prominence over LT area, nontender. limited wrist motion. Pulses: palpable  right radial and ulnar at wrist. palpable ulnar, absent radial on left. bluish  discoloration digits both hands, worse on the right. deformity right middle  finger with hooked nail and flexed DIP. skin intact. delayed digital cap  refill bilaterally. sensation mildly decreased bilaterally in digits.    Xrays of right wrist reviewed by me demonstrate calcinosis over LT joint and  chondrocalcinosis of TFCC.       **Assessments**    ---    1\. 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 Scleroderma - M34.9    ---    3\. Raynaud''s phenomenon without gangrene - I73.00    ---    4\. 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 Heterotopic ossification**    Notes: with respect to left elbow, encouraged to continue with exercises. with  respect to right wrist, I reviewed Xrays with her. The inflammatory reaction  appears to be improving, and she was instructed on ROM. with respect to hands,  she appears to have small vessel disease related to her scleroderma. She may  be a candidate for digital sympathectomy. I  have recommended a right upper  extremity arteriogram with runoff to assess arch and digital circulaiton. She  would like to proceed. With respect to middle finger, I am concerned that  debridement of calcinosis will jeopardize digit. will defer until angiogram.    ---      **Follow Up**    ---    after angiogram    Electronically signed by [name] , MD on 09/14/2017 at 09:51 PM EDT    Sign off status: Completed        * * *        [facility]    [address]    [place], [address]    [place], [place] [address]    Tel: [phone]    Fax: [phone]              * * *          Patient: [name], [name] DOB: 05/30/1985 Progress Note: [name], MD  09/10/2017    ---    Note generated by eClinicalWorks EMR/PM Software ([email or web address])

## 2017-09-10 NOTE — Progress Notes (Signed)
.  Progress Notes  .  Patient: [name], [name]  Provider: [name]    .  DOB: March 25, 1986 Age: 32 Y Sex: Female  .  PCP: Md Cathrine Muster  MD  Date: 09/10/2017  .  --------------------------------------------------------------------------------  .  REASON FOR APPOINTMENT  .  1. R WRIST PAIN  .  2. Poor circulation bilateral hands  .  HISTORY OF PRESENT ILLNESS  .  GENERAL:   doing well with respect to elbow. developed right wrist swelling  and pain was seen elsewhere, had negative aspiration for  infection. pain has been improving. motion limited but improving.  most worried about bilateral hand circulation. intermittent  bluish discoloration of digits, worse on the right, especially  when in cold environments. also worried about tip of middle  finger which feels very tight.  .  CURRENT MEDICATIONS  .  Taking doxycycline 100MG   Taking IVIg 800 mg infusion Oral 2x a month  Taking Macrobid 100 MG Capsule 1 capsule with food Orally every  12 hrs  Taking Vitamin B Complex - Tablet Orally  Taking Vitamin C Tablet Oral  Taking Vitamin D 2000 U 1 tab every day  Taking [facility] Thyroid 32.5 MG Tablet 1 tablet on an empty stomach  Orally Once a day  Medication List reviewed and reconciled with the patient  .  PAST MEDICAL HISTORY  .  Medical History Verified.  .  ALLERGIES  .  Keflex: Allergy  Cephalexin  .  SURGICAL HISTORY  .  Hand surgery-left elbow- rt hand calcium excision  Radical resection, heterotopic bone and capsule, left elbow  07/24/2017  .  FAMILY HISTORY  .  : unknown  No autoimmune.  .  SOCIAL HISTORY  .  Marland Kitchen  Work/Occupation: full-time Consulting civil engineer.  .  Alcohol Yes but rarely.  .  Marital Status Single.  .  Tobaccohistory:Never smoked.  Marland Kitchen  HOSPITALIZATION/MAJOR DIAGNOSTIC PROCEDURE  .  Denies Past Hospitalization  .  REVIEW OF SYSTEMS  .  ORT:  .  Eyes    No . Ear, Nose Throat    No . Digestion, Stomach, Bowel     No . Bladder Problems    No . Bleeding Problems    No .  Numbness/Tingling    No . Anxiety/Depression     No .  Fever/Chills/Fatigue    No . Chest Pain/Tightness/Palpitations     No . Skin Rash    No . Dental Problems    No . Joint/Muscle  Pain/Cramps    No . Blackout/Fainting    No . Other    No .  .  VITAL SIGNS  .  Pain scale 6, Ht-in 5'5, Ht-cm 152.4.  .  PHYSICAL EXAMINATION  .  appears healthy. left elbow incision well healed, ROM -15/140.  right wrist: prominence over LT area, nontender. limited wrist  motion. Pulses: palpable right radial and ulnar at wrist.  palpable ulnar, absent radial on left. bluish discoloration  digits both hands, worse on the right. deformity right middle  finger with hooked nail and flexed DIP. skin intact. delayed  digital cap refill bilaterally. sensation mildly decreased  bilaterally in digits. Xrays of right wrist reviewed by me  demonstrate calcinosis over LT joint and chondrocalcinosis of  TFCC.  .  ASSESSMENTS  .  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  Scleroderma - M34.9  .  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  Contracture of left elbow - M24.522  .  TREATMENT  .  Heterotopic ossification  Notes: with respect to left elbow, encouraged to continue with  exercises. with respect to right wrist, I reviewed Xrays with  her. The inflammatory reaction appears to be improving, and she  was instructed on ROM. with respect to hands, she appears to have  small vessel disease related to her scleroderma. She may be a  candidate for digital sympathectomy. I have recommended a right  upper extremity arteriogram with runoff to assess arch and  digital circulaiton. She would like to proceed. With respect to  middle finger, I am concerned that debridement of calcinosis will  jeopardize digit. will defer until angiogram.  .  FOLLOW UP  .  after angiogram  .  Electronically signed by [name] , MD on  09/14/2017 at 09:51 PM EDT  .  Document electronically signed by [name]    .

## 2017-09-12 ENCOUNTER — Ambulatory Visit: Admitting: Hand Surgery

## 2017-09-12 NOTE — Progress Notes (Signed)
* * *        **  [name], [name]**    --- ---    2 Y old Female, DOB: 01-Dec-1985    [address] [place], [place] [address]    Home: [phone]    Provider: [name]        * * *    Telephone Encounter    ---    Answered by   Charyl Dancer  Date: 09/12/2017         Time: 11:52 AM    Caller   Lucretia Roers    --- ---            Reason   Call back            Message                      Hi,      Janela from interventional radiology is requesting a call back from you regarding the patient. Please call her ext. at [phone].      Thank you                 Action Taken                      Frimpong,Eric  09/12/2017 11:53:45 AM >       [name],[name]  09/12/2017 12:52:12 PM > Spoke to [place]. Angiogram scheduled for 09/17/17 @ 1pm.                    * * *                ---          * * *          Patient: [name], [name] DOB: December 28, 1985 Provider: [name] 09/12/2017    ---    Note generated by eClinicalWorks EMR/PM Software ([email or web address])

## 2017-09-15 ENCOUNTER — Ambulatory Visit: Admitting: Hand Surgery

## 2017-09-15 NOTE — Progress Notes (Signed)
* * *        **  [name], [name]**    --- ---    64 Y old Female, DOB: May 09, 1985    [address] [place], [place] [address]    Home: [phone]    Provider: [name]        * * *    Telephone Encounter    ---    Answered by   Teddy Spike  Date: 09/15/2017         Time: 11:29 AM    Caller   Patient    --- ---            Reason   Call back            Message                      Hi,            Patient want to let Erasmo Score know she is receiving blood work tomorrow, and she received the kidney function test last week. Patient is requesting to speak to [facility] to see what other test Dr. Rodman Pickle want her to take. Patient can be reached at [phone].            Thanks                Action Taken                      [name],[name]  09/15/2017 11:35:11 AM >       [name],[name]  09/15/2017 11:48:13 AM > Action - Pt telephoned.  Spoke to pt.                    * * *                ---          * * *          Patient: [name], [name] DOB: Apr 05, 1985 Provider: [name] 09/15/2017    ---    Note generated by eClinicalWorks EMR/PM Software ([email or web address])

## 2017-09-17 ENCOUNTER — Ambulatory Visit: Admitting: Hand Surgery

## 2017-09-18 LAB — CHLAMYDIA/GC (EXT)
Chlamydia trachomatis, Urine (EXT): NEGATIVE
Neisseria Gonorrhoeae, Urine (EXT): NEGATIVE

## 2017-09-22 ENCOUNTER — Ambulatory Visit: Admitting: Hand Surgery

## 2017-09-22 LAB — HX COAGULATION
HX INR PT: 1 (ref 0.9–1.3)
HX PROTHROMBIN TIME: 11.2 s (ref 9.7–14.0)

## 2017-09-22 LAB — HX CHEM-PANELS
HX ANION GAP: 8 (ref 3–14)
HX BLOOD UREA NITROGEN: 12 mg/dL (ref 6–24)
HX CHLORIDE (CL): 107 meq/L (ref 98–110)
HX CO2: 22 meq/L (ref 20–30)
HX CREATININE (CR): 0.77 mg/dL (ref 0.57–1.30)
HX GFR, AFRICAN AMERICAN: 117 mL/min/{1.73_m2}
HX GFR, NON-AFRICAN AMERICAN: 101 mL/min/{1.73_m2}
HX GLUCOSE: 96 mg/dL (ref 70–139)
HX POTASSIUM (K): 4 meq/L (ref 3.6–5.1)
HX SODIUM (NA): 137 meq/L (ref 135–145)

## 2017-09-22 LAB — HX DIABETES: HX GLUCOSE: 96 mg/dL (ref 70–139)

## 2017-09-22 LAB — HX IMMUNOLOGY: HX BETA HCG QUANT: <5 m[IU]/mL (ref 0.0–5.0)

## 2017-09-22 LAB — HX HEM-ROUTINE: HX PLT: 187 10*3/uL (ref 150–400)

## 2017-09-29 ENCOUNTER — Ambulatory Visit: Admitting: Hand Surgery

## 2017-09-29 NOTE — Progress Notes (Signed)
* * *        **  [name], [name]**    --- ---    68 Y old Female, DOB: 01/30/1986    [address], [place], [place] [address]    Home: [phone]    Provider: [name]        * * *    Telephone Encounter    ---    Answered by   Wilburt Finlay  Date: 09/29/2017         Time: 02:33 PM    Caller   [name], pt    --- ---            Reason   Surgery            Message                      Good Afternoon,             [name] is calling in regards to see how far surgery is booking. She can be reached at, [phone].             Thank you!                 Action Taken                      Gilles,Guerbie  09/29/2017 2:34:43 PM > Called back. Received a message that "person I was calling was not accepting calls at this time". Unable to leave a message.                    * * *                ---          * * *          Patient: [name], [name] DOB: 1986/01/17 Provider: [name] 09/29/2017    ---    Note generated by eClinicalWorks EMR/PM Software ([email or web address])

## 2017-10-03 ENCOUNTER — Ambulatory Visit: Admitting: Otolaryngology

## 2017-10-03 NOTE — Progress Notes (Signed)
.  Progress Notes  .  Patient: [name]  Provider: Armando Gang  DOB: October 04, 1985 Age: 32 Y Sex: Female  .  PCP: Unknown, Unknown    Date: 10/03/2017  .  --------------------------------------------------------------------------------  .  REASON FOR APPOINTMENT  .  1. NP/CONSULT;HX RHINOPLASTY OVERSEAS; DIFFICULT BREATHING, CPT  .  2. ALee nasal congestion  .  HISTORY OF PRESENT ILLNESS  .  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 She underwent septorhinoplasty  in [place] in August 2018. She is now able to breathe through the  nose but is unhappy with the appearance. She sees significant  asymmetries of the tip and the dorsum. She is concerned about an  over-rotated and under projected nasal tip. The columella is  asymmetric and hanging. The nose is twisted. The patient denies  anosmia, epistaxis, rhinorrhea, vision changes, nasal pain, easy  bruising or bleeding.  .  CURRENT MEDICATIONS  .  Taking doxycycline 100MG   Taking IVIg 800 mg infusion Oral 2x a month  Taking Macrobid 100 MG Capsule 1 capsule with food Orally every  12 hrs  Taking Vitamin B Complex - Tablet Orally  Taking Vitamin C Tablet Oral  Taking Vitamin D 2000 U 1 tab every day  Taking [facility] Thyroid 32.5 MG Tablet 1 tablet on an empty stomach  Orally Once a day  .  PAST MEDICAL HISTORY  .  Systemic sclerosis  .  ALLERGIES  .  Keflex: Allergy  Cephalexin  gandolinium  .  SURGICAL HISTORY  .  Hand surgery-left elbow- rt hand calcium excision 06/23/2017  Radical resection, heterotopic bone and capsule, left elbow  07/24/2017  .  FAMILY HISTORY  .  : unknown  Father: diagnosed with Diabetes  No autoimmune.  .  SOCIAL HISTORY  .  [facility] Kitchen  Work/Occupation: full-time Consulting civil engineer.  .  Alcohol Yes but rarely.  .  Marital Status Single.  .  Tobaccohistory:Never smoked.  [facility] [facility]  REVIEW OF SYSTEMS  .  ENT:  .  Unxplained weight loss: No. Change in Vision: No. Dizziness: No.  Chest Pain: No. Diarrhea: No.  Muscle or joint pain: No.  Kidney/bladder problems: No. Weakness or Fatigue: No.  Fevers/Night Sweats: No. Heat/Cold Intolerance: No. Shortness of  Breath: No. Bleeding/bruising easily: No. Constipation: No.  Trouble Swallowing: No. Headaches: No. Numbness of  face/hands/feet: No.  .  VITAL SIGNS  .  Pain scale 0, Ht-in 5'5, Ht-cm 152.4.  .  PHYSICAL EXAMINATION  .  General Examination:  General Appearance  well developed, well nourished, alert,  oriented, in no acute distress, breathing comfortably without  stridor or dyspnea. Head  normocephalic.  ENT Examination ::  Speech and affect are  normal. Face : there  is not palpable  sinus tenderness and facial strenght is intact and symmetric . is  not palpable sinus tenderness and facial strenghtis intact and  symmetric. Facial symmetry is  Normal. Skin: Fitzpatrick type   III skin, There are no skin lesions I could appreciate. Thin  contracted skin consisent with scleroderma. Nose:  Dorsum is well  supportet. persistent open roof. Tip is underprojected and over  rotated. Right alar retraction. Tip over narrowed. s/p base  reduction. Nasal skin is  thin. Radix is  normal. Bony pyrmid is   relatively midline with visible lateral ridges. The left bone is  more medialized. Middle vault is  depressed on the right. Base  is   is s/p reduction.  ENT Nasal Cavity ::  Septum is  midline. Caudal septum is partially resected. Inferior  Turbinates were  normal.  Skin/Neck/Lymph:  Lymph nodes  normal. Larynx, trachea (eg, position, crepitus,  tenderness)  normal.  Neuro:  Facial strength (HB Grade)  1. Other crainial nerves intact (2-6,  8-12)  normal.  Ears, Nose, Mouth and Throat:  Pinna and Auricle  normal. External auditory canals  normal.  Tympanic membrances/TM mobility, retraction, effusion  normal.  External appearance of nose (deformity, scars, lesions)  normal.  Nasal mucosa, septum, turbinate  normal. Lips, teeth, gums   normal. Appearance of oral mucosa  normal.  Posterior pharyngeal  wall  normal. Normal soft palate and uvula length.  Eyes:  Ocular motility  normal.  .  ASSESSMENTS  .  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  The patient has significant nasal asymmetry and deformity after a  rhinoplasty. She will need to nasal tip reconstructed to address  the overrotation and alar retraction. She will need spreader  grafts for the middle vault as well as likely camouflaging grafts  with cartilage, temporalis fascia, or acellular dermis. She may  need osteotomies as well. We discussed need for possible  auricular and costal cartilage harvest. The procedures were  discussed with the patient in detail. Also discussed were some of  the risks, which include, but are not limited to bleeding,  infection, reaction to anesthesia, septal perforation, continued  symptoms, recurrence of symptoms, anosmia, poor cosmetic outcome,  pneumothorax, and the possible need for further procedures. All  questions were answered. She will follow up prn.  [place]  PROCEDURES  .  After verbal consent was obtained, the nasal  cavities were examined with a 0 degree rigid endoscope. The scope  was placed into the right nasal cavity. There was no evidence of  polypoid masses or purulence. The septum was without  perforations. It was palpated with a q tip and there appears to  be a submucous resection site. The caudal and dorsal strut are  about 1.5 cm. The scope was placed into the left nasal cavity.  There was no evidence of polypoid masses or purulence. There were  no mucosa lesions on either side.The patient tolerated the  procedure well.  [facility] Kitchen  PROCEDURE CODES  .  [id number] NASAL ENDOSCOPY, DX  .  FOLLOW UP  .  prn  .  Electronically signed by Rusty Aus , MD on  10/03/2017 at 01:02 PM EDT  .  Document electronically signed by LEE, ARNOLD S   .

## 2017-10-03 NOTE — Progress Notes (Signed)
* * *        **[name], [name]**    --- ---    32 Y old Female, DOB: 10/13/1985, External MRN: [id number]    Account Number: [id number]    [address], [name]    Home: [phone]    Insurance: [facility] [employer] HMO Payer ID: PAPER    PCP: Unknown Unknown Referring: Unknown Unknown External Visit ID: [id number]    Appointment Facility: Otolaryngology        * * *    10/03/2017  Progress Notes: Armando Gang, MD **CHN#:** [id number]    --- ---    ---         **Current Medications**    ---    Taking     * doxycycline 100MG      ---    * IVIg 800 mg infusion Oral 2x a month    ---    * Macrobid 100 MG Capsule 1 capsule with food Orally every 12 hrs    ---    * Vitamin B Complex - Tablet Orally     ---    * Vitamin C Tablet Oral     ---    * Vitamin D 2000 U 1 tab every day    ---    * WP Thyroid 32.5 MG Tablet 1 tablet on an empty stomach Orally Once a day    ---      Past Medical History    ---       Systemic sclerosis.        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    ---       **Social History**    ---    Work/Occupation: full-time Consulting civil engineer.    Alcohol  Yes but rarely.    Marital Status  Single.    Tobacco history: Never smoked.      **Allergies**    ---       Keflex: Allergy    ---    Cephalexin    ---    gandolinium    ---       **Review of Systems**    ---     _ENT_ :    Unxplained weight loss: No. Change in Vision: No. Dizziness: No. Chest Pain:  No. Diarrhea: No. Muscle or joint pain: No. Kidney/bladder problems: No.  Weakness or Fatigue: No. Fevers/Night Sweats: No. Heat/Cold Intolerance: No.  Shortness of Breath: No. Bleeding/bruising easily: No. Constipation: No.  Trouble Swallowing: No. Headaches: No. Numbness of face/hands/feet: No.             **Reason for Appointment**    ---       1\. NP/CONSULT;HX RHINOPLASTY OVERSEAS; DIFFICULT BREATHING, CPT    ---    2\. 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 She underwent septorhinoplasty in [place] in August  2018. She is now able to breathe through the nose but is unhappy with the  appearance. She sees significant asymmetries of the tip and the dorsum. She is  concerned about an over-rotated and under projected nasal tip. The columella  is asymmetric and hanging. The nose is twisted. The patient denies anosmia,  epistaxis, rhinorrhea, vision changes, nasal pain, easy bruising or bleeding.       **Vital Signs**    ---    Pain scale 0, Ht-in 5'5, Ht-cm 152.4.       **Physical Examination**    ---     _General Examination_ :    General Appearance well developed, well nourished, alert, oriented, in no  acute distress, breathing comfortably without stridor or dyspnea. Head  normocephalic.    _ENT Examination :_ :    Speech and affect are normal. Face : there is not palpable sinus tenderness  and facial strenght is intact and symmetric. Facial symmetry is Normal. Skin:  Fitzpatrick type III skin, There are no skin lesions I could appreciate. Thin  contracted skin consisent with scleroderma. Nose: Dorsum is well supportet.  persistent open roof. Tip is underprojected and over rotated. Right alar  retraction. Tip over narrowed. s/p base reduction. Nasal skin is thin. Radix  is normal. Bony pyrmid is relatively midline with visible lateral ridges. The  left bone is more medialized. Middle vault is depressed on the right. Base is  is s/p reduction.    _ENT Nasal Cavity :_ :    Septum is midline. Caudal septum is partially resected. Inferior Turbinates  were normal.    _Skin/Neck/Lymph_ :    Lymph nodes normal. Larynx, trachea (eg, position, crepitus, tenderness)  normal.     _Neuro_ :    Facial strength (HB Grade) 1. Other crainial nerves intact (2-6, 8-12) normal.    _Ears, Nose, Mouth and Throat_ :    Pinna and Auricle normal. External auditory canals normal. Tympanic  membrances/TM mobility, retraction, effusion normal. External appearance of  nose (deformity, scars, lesions) normal. Nasal mucosa, septum, turbinate  normal. Lips, teeth, gums normal. Appearance of oral mucosa normal. Posterior  pharyngeal wall normal. Normal soft palate and uvula length.    _Eyes_ :    Ocular motility normal.          **Assessments**    ---    1\. 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 She will need to nasal tip reconstructed to address the  overrotation and alar retraction. She will need spreader grafts for the middle  vault as well as likely camouflaging grafts with cartilage, temporalis fascia,  or acellular dermis. She may need osteotomies as well. We discussed need for  possible auricular and costal cartilage harvest. The procedures were discussed  with the patient in detail. Also discussed were some of the risks, which  include, but are not limited to bleeding, infection, reaction to anesthesia,  septal perforation, continued symptoms, recurrence of symptoms, anosmia, poor  cosmetic outcome, pneumothorax, and the possible need for further procedures.  All questions were answered. She will follow up prn.    ---       **Procedures**    ---    After verbal consent was obtained, the nasal cavities were examined with a 0  degree rigid endoscope. The scope was placed into the  right nasal cavity.  There was no evidence of polypoid masses or purulence. The septum was without  perforations. It was palpated with a q tip and there appears to be a submucous  resection site. The caudal and dorsal strut are about 1.5 cm. The scope was  placed into the left nasal cavity. There was no evidence of polypoid masses  or  purulence. There were no mucosa lesions on either side.    The patient tolerated the procedure well.       **Procedure Codes**    ---       13086 NASAL ENDOSCOPY, DX    ---       **Follow Up**    ---    prn    Electronically signed by Rusty Aus , MD on 10/03/2017 at 01:02 PM EDT    Sign off status: Completed        * * *        Otolaryngology    [address]    [address]    [place], [place] [address]    Tel: [phone]    Fax: [phone]              * * *          Patient: [name], [name] DOB: 08-20-1985 Progress Note: Armando Gang, MD  10/03/2017    ---    Note generated by eClinicalWorks EMR/PM Software ([email or web address])

## 2017-10-10 ENCOUNTER — Ambulatory Visit: Admitting: Hand Surgery

## 2017-10-10 NOTE — Progress Notes (Signed)
* * *        **  [name], [name]**    --- ---    51 Y old Female, DOB: 03/11/1986    [address] [place], [place] [address]    Home: [phone]    Provider: [name]        * * *    Telephone Encounter    ---    Answered by   Andrey Spearman  Date: 10/10/2017         Time: 03:51 PM    Reason   Waiting for call back    --- ---            Message                      Good Afternoon,       [name] is calling in regards to her surgical appt with Dr. Rodman Pickle. She can be reached at, [phone].                             Action Taken                      [name],[name]  10/10/2017 3:52:27 PM >       Thank you       [name],[name]  10/22/2017 11:06:14 AM > Patient called again to confirm her surgical appointment w/ Dr. Rodman Pickle.      [name],[name]  10/22/2017 11:35:18 AM > Email sent to patient with answers.                    * * *                ---          * * *          Patient: [name], [name] DOB: [birth year] Provider: [name] 10/10/2017    ---    Note generated by eClinicalWorks EMR/PM Software ([email or web address])

## 2017-10-28 ENCOUNTER — Ambulatory Visit: Admitting: Hand Surgery

## 2017-10-28 LAB — HX BF-URINALYSIS: HX URINE PREGNANCY TEST (HCG QUAL): NEGATIVE

## 2017-10-28 NOTE — Op Note (Signed)
Patient    [name], [name]              Med Rec #:  [id number]  Name:  Operation  10/28/2017                Pt.  Dt:                                  Location:  .  [facility] Kitchen                               OPERATIVE REPORT  .  Marland Kitchen  PREOPERATIVE DIAGNOSES:  1.  Sjogren's syndrome.  2.  Raynaud's syndrome, right hand.  3.  Calcinosis, right wrist and hand.  Marland Kitchen  POSTOPERATIVE DIAGNOSES:  1.  Sjogren's syndrome.  2.  Raynaud's syndrome, right hand.  3.  Calcinosis, right wrist and hand.  Marland Kitchen  PROCEDURE:  1.  Sympathectomy, right superficial palmar arch.  2.  Sympathectomy, right radial artery.  3.  Sympathectomy, right ulnar artery.  4.  Sympathectomy, common digital artery, right index/middle.  5.  Sympathectomy common digital artery, right middle/ring.  6.  Sympathectomy common digital artery, right ring/small.  7.  Excision of calcinosis, right triquetrum.  8.  Excision of subcutaneous calcinosis, right index finger.  9.  Excision of deep calcinosis, right middle finger.  10.  Excision of calcinosis, deep right ring finger.  .  SURGEON:  [name], M.D.  .  ASSISTANT:  Arlyss Repress, M.D., and Carlynn Herald, M.D.  .  ANESTHESIA:  General.  .  COMPLICATIONS:  None.  .  ESTIMATED BLOOD LOSS:  Minimal.  .  TOURNIQUET TIME:  Approximately 2 hours.  .  INDICATIONS:  She has Sjogren's syndrome with severe manifestations  including severe Raynaud's, cold intolerance, deformity, and calcinosis.  She demonstrated improved perfusion following administration of a  vasodilator during her arteriogram, and is consequently a candidate for  sympathectomy.  She would also like to have her symptomatic calcinosis  debrided, which drain intermittently.  She has 1 over the right triquetrum  dorsally, 1 over the ulnar aspect of the pulp of the right index finger, 1  over the volar aspect of the PIP crease of the right middle finger, and 1  over the ulnar aspect of the DIP joint in the ring finger.  She also has a  deformity of the distal  phalanx of the right middle finger with extensive  calcinosis, which I explained is quite complex and may be better treated  with amputation at a later date.  Risks of surgery including but not  limited to infection, nerve damage, stiffness, and failure to achieve  lasting improvement in perfusion and recurrence of the calcinosis were  explained to the patient preoperatively, who wished to proceed.  .  DESCRIPTION OF PROCEDURE:  Following adequate anesthesia, the patient was  placed in the supine position on the operating table.  The right upper  extremity was prepped and draped in a sterile fashion.  Limb was  exsanguinated by gravity and a proximal arm tourniquet elevated to 230  mmHg.  Attention was first directed to the hand.  An oblique incision was  made along the level of the proximal palmar crease, curving this proximally  over the region of Guyon's canal.  The flaps were elevated.  The  superficial palmar fascia was divided,  and the superficial palmar arch was  identified.  Next, longitudinal incisions were made overlying the volar  aspect of the radial artery and ulnar artery, and an oblique incision was  made in the snuffbox overlying the deep radial artery.  Careful dissection  was carried through the subcutaneous tissue.  Hemostasis was achieved with  bipolar electrocautery.  The vessels were identified and vessel loops were  placed around them.  Next, the microscope was brought in.  A sympathectomy  of the superficial palmar arch was then performed, dividing branches from  the digital nerves to the arch, as well as performing an adventitial  stripping under the operating microscope.  The common digital arteries to  the index middle, middle ring, and ring small were then addressed, dividing  the digital nerve branches to the arteries and then performing adventitial  stripping of the arteries with care to protect the arteries themselves  throughout the procedure.  Care was taken to protect the motor  branches to  the lumbricals as well.  .  Next, a sympathectomy of the radial artery volarly was performed,  performing adventitial stripping for a distance of 2 cm.  .  Next, a sympathectomy of the ulnar artery was performed by performing  adventitial stripping of the ulnar artery for a distance of 2 cm.  .  Next, a sympathectomy of the deep radial artery was performed, performing  an adventitial stripping of a distance of 2 cm.  All were performed under  the operating microscope.  .  The operating microscope was then removed.  A transverse incision was made  directly overlying the triquetrum dorsally.  Careful dissection was carried  through the subcutaneous tissue.  Hemostasis was achieved using bipolar  electrocautery.  The calcinosis adherent to the dorsal triquetrum was  curetted and excised.  .  Next, an ulnar mid axial incision was made at the pulp of the right index  finger.  The skin was carefully elevated from the calcinosis and digital  nerve branches were protected.  The calcinosis was then removed.  .  Next, a V-shaped incision was made over the volar aspect of the right  middle finger.  Flaps were elevated, protecting the radial and ulnar  digital neurovascular bundles.  The calcinosis was identified and was deep.  It was within the flexor tendon sheath as well as along the radial aspect,  emanating from the region of the radial collateral ligament.  Calcinosis  was debrided with care to protect the flexor tendon pulleys and radial  collateral ligament.  .  Next, an ulnar mid axial incision was made at the DIP joint of the right  ring finger.  Extensive deep calcinosis was present at this level and was  excised with care to protect the digital neurovascular bundle.  The masses  in all 3 areas measured less than 1.5 cm.  The index finger was superficial  and the middle and ring fingers were deep.  The tourniquet was released.  The fingers pinked immediately with good capillary refill.  Hemostasis  was  achieved with pressure followed by bipolar electrocautery.  Local  infiltration was performed using 0.25% Marcaine.  The skin was approximated  with 5-0 nylon interrupted sutures.  Sterile dressing was applied.  The  patient tolerated the procedure well and was discharged to the recovery  room in good condition.  .  .  .  Electronically Signed  [name], MD 11/04/2017 04:03 A  .  .  .  [facility] Kitchen  Dictated by: [name], MD  .  D:    11/02/2017  T:    11/02/2017 06:28 P  Dictation ID:  [id number]/Doc#  [id number]  .  cc:  .  Mar[place] Kitchen      Document is preliminary until electronically or manually signed by                             attending physician.

## 2017-10-31 ENCOUNTER — Ambulatory Visit

## 2017-10-31 ENCOUNTER — Ambulatory Visit: Admitting: Hand Surgery

## 2017-10-31 NOTE — Progress Notes (Signed)
.  Progress Notes  .  Patient: [name], [name]  Provider: [name]    .  DOB: January 15, 1986 Age: 32 Y Sex: Female  .  PCP: Md Cathrine Muster  MD  Date: 10/31/2017  .  --------------------------------------------------------------------------------  .  REASON FOR APPOINTMENT  .  1. S/p right hand sympathectomy on 10/28/17  .  HISTORY OF PRESENT ILLNESS  .  GENERAL:   32 year old woman with a history of stable scleroderma s/p right  hand sympathectomy on 10/28/17. She is going well post op. She has  had virtually no pain since surgery. She reports no bleeding and  only mild inflammation. She has kept her hand bandaged, as  indicated.  .  CURRENT MEDICATIONS  .  Taking doxycycline 100MG   Taking IVIg 800 mg infusion Oral 2x a month  Taking Macrobid 100 MG Capsule 1 capsule with food Orally every  12 hrs  Taking Vitamin B Complex - Tablet Orally  Taking Vitamin C Tablet Oral  Taking Vitamin D 2000 U 1 tab every day  Taking [facility] Thyroid 32.5 MG Tablet 1 tablet on an empty stomach  Orally Once a day  .  PAST MEDICAL HISTORY  .  Systemic sclerosis  .  ALLERGIES  .  Keflex: Allergy  Cephalexin  gandolinium  .  SURGICAL HISTORY  .  Hand surgery-left elbow- rt hand calcium excision 06/23/2017  Radical resection, heterotopic bone and capsule, left elbow  07/24/2017  .  FAMILY HISTORY  .  : unknown  Father: diagnosed with Diabetes  No autoimmune.  .  SOCIAL HISTORY  .  Marland Kitchen  Work/Occupation: full-time Consulting civil engineer.  .  Alcohol Yes but rarely.  .  Marital Status Single.  .  Tobaccohistory:Never smoked.  Marland Kitchen  HOSPITALIZATION/MAJOR DIAGNOSTIC PROCEDURE  .  No Hospitalization History.  Marland Kitchen  REVIEW OF SYSTEMS  .  ORT:  .  Eyes    No . Ear, Nose Throat    No . Digestion, Stomach, Bowel     No . Bladder Problems    No . Bleeding Problems    No .  Numbness/Tingling    No . Anxiety/Depression    No .  Fever/Chills/Fatigue    No . Chest Pain/Tightness/Palpitations     No . Skin Rash    No . Dental Problems    No . Joint/Muscle  Pain/Cramps    Yes .  Blackout/Fainting    No . Other    No .  .  VITAL SIGNS  .  Pain scale 0, Ht-in 5'5, Ht-cm 152.4.  .  PHYSICAL EXAMINATION  .  Right wrist: s/p sympathectomy. Incisions look clean, dry and  intact. Mild TTP over incisions. Limited wrist and finger motion  due to postoperative pain. Pulses: Palpable right radial and  ulnar at wrist. Deformity right middle finger with hooked nail  and flexed DIP. skin intact.  .  ASSESSMENTS  .  Scleroderma - M34.9 (Primary)  .  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  TREATMENT  .  Scleroderma  Notes: 32 yo female with a history of scleroderma presenting for  a postoperative visit after wrist right sympathectomy on 10/28/17.  She is doing great and has no pain. Her dressing was changed  today. She was advised to continue wound care and avoid  submerging the wounds until the sutures are removed. We will plan  to remove stitches in 3 weeks. She can begin OT and move the  wrist and fingers as tolerated.  Patient was seen and evaluated  with Dr. Rodman Pickle. All questions were answered.  .  FOLLOW UP  .  3 Weeks  .  Electronically signed by [name] , MD on  11/02/2017 at 11:11 AM EDT  .  Document electronically signed by [name]    .

## 2017-10-31 NOTE — Progress Notes (Signed)
* * *        **[name], [name]**    --- ---    32 Y old Female, DOB: April 04, 1985, External MRN: [id number]    Account Number: [id number]    [address], [name]    Home: [phone]    Insurance: [employer]    PCP: [name], MD Referring: [name], MD    Appointment Facility: [facility]        * * *    10/31/2017  Progress Notes: [name], MD **CHN#:** [phone]    --- ---    ---         **Reason for Appointment**    ---       1\. S/p right hand sympathectomy on 10/28/17    ---       **History of Present Illness**    ---     _GENERAL_ :    32 year old woman with a history of stable scleroderma s/p right hand  sympathectomy on 10/28/17. She is going well post op. She has had virtually no  pain since surgery. She reports no bleeding and only mild inflammation. She  has kept her hand bandaged, as indicated.       **Current Medications**    ---    Taking     * doxycycline 100MG      ---    * IVIg 800 mg infusion Oral 2x a month    ---    * Macrobid 100 MG Capsule 1 capsule with food Orally every 12 hrs    ---    * Vitamin B Complex - Tablet Orally     ---    * Vitamin C Tablet Oral     ---    * Vitamin D 2000 U 1 tab every day    ---    * WP Thyroid 32.5 MG Tablet 1 tablet on an empty stomach Orally Once a day    ---       **Past Medical History**    ---       Systemic sclerosis.        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    ---       **Social History**    ---    Work/Occupation: full-time Consulting civil engineer.    Alcohol  Yes but rarely.    Marital Status  Single.    Tobacco  history: Never smoked.      **Allergies**    ---       Keflex: Allergy    ---    Cephalexin    ---    gandolinium    ---        **Hospitalization/Major Diagnostic Procedure**    ---       No Hospitalization History.    ---       **Review of Systems**    ---     _ORT_ :    Eyes No. Ear, Nose Throat No. Digestion, Stomach,  Bowel No. Bladder Problems  No. Bleeding Problems No. Numbness/Tingling No. Anxiety/Depression No.  Fever/Chills/Fatigue No. Chest Pain/Tightness/Palpitations No. Skin Rash No.  Dental Problems No. Joint/Muscle Pain/Cramps Yes. Blackout/Fainting No. Other  No.          **Vital Signs**    ---    Pain scale 0, Ht-in 5'5, Ht-cm 152.4.       **Physical Examination**    ---    Right wrist: s/p sympathectomy. Incisions look clean, dry and intact. Mild TTP  over incisions. Limited wrist and finger motion due to postoperative pain.  Pulses: Palpable right radial and ulnar at wrist. Deformity right middle  finger with hooked nail and flexed DIP. skin intact.       **Assessments**    ---    1\. Scleroderma - M34.9 (Primary)    ---    2\. Raynaud''s phenomenon without gangrene - I73.00    ---       **Treatment**    ---       **1\. Scleroderma**    Notes: 32 yo female with a history of scleroderma presenting for a  postoperative visit after wrist right sympathectomy on 10/28/17. She is doing  great and has no pain. Her dressing was changed today. She was advised to  continue wound care and avoid submerging the wounds until the sutures are  removed. We will plan to remove stitches in 3 weeks. She can begin OT and move  the wrist and fingers as tolerated. Patient was seen and evaluated with Dr.  Rodman Pickle. All questions were answered.    ---      **Follow Up**    ---    3 Weeks    Electronically signed by [name] , MD on 11/02/2017 at 11:11 AM EDT    Sign off status: Completed        * * *        [facility]    [address]    [place], [address]    [place], [place] [address]    Tel: [phone]    Fax: [phone]              * * *          Patient: [name] DOB: [birth year] Progress Note: [name],  MD  10/31/2017    ---    Note generated by eClinicalWorks EMR/PM Software ([email or web address])

## 2017-11-01 ENCOUNTER — Ambulatory Visit

## 2017-11-07 ENCOUNTER — Ambulatory Visit: Admitting: Hand Surgery

## 2017-11-07 NOTE — Progress Notes (Signed)
* * *        **  Borg, Henri**    --- ---    53 Y old Female, DOB: [birth year]    [address], [place], [place] [address]    Home: [phone]    Provider: [name]        * * *    Telephone Encounter    ---    Answered by   Charyl Dancer  Date: 11/07/2017         Time: 09:38 AM    Caller   Tarri    --- ---            Reason   PT SCRIPT            Message                      Hi,      Patient is requesting PT script for her right hand faxed to [facility] at [phone] and [facility] at [phone]. She said its urgent and they wont see her without a script.       Thank you                 Action Taken                      Frimpong,Eric  11/07/2017 9:43:49 AM >       [name],[name]  11/07/2017 9:52:54 AM > FAXED                    * * *                ---          * * *          Patient: Leppo, Harmoney DOB: 1985/06/27 Provider: [name] 11/07/2017    ---    Note generated by eClinicalWorks EMR/PM Software ([email or web address])

## 2017-11-12 LAB — HEPATITIS C ANTIBODY (EXT): HEPATITIS C ANTIBODY (EXT): 0.18 {index_val} (ref <0.80)

## 2017-11-13 LAB — CHLAMYDIA/GC (EXT)
Chlamydia trachomatis, Urine (EXT): NEGATIVE
Neisseria Gonorrhoeae, Urine (EXT): NEGATIVE

## 2017-11-14 ENCOUNTER — Ambulatory Visit: Admitting: Hand Surgery

## 2017-11-14 ENCOUNTER — Ambulatory Visit: Admitting: Surgical

## 2017-11-14 NOTE — Progress Notes (Signed)
* * *        **[name], [name]**    --- ---    16 Y old Female, DOB: 07-19-1985, External MRN: [id number]    Account Number: [id number]    [address], [name]    Home: [phone]    Insurance: [employer]    PCP: [name], MD Referring: [name], MD    Appointment Facility: [facility]        * * *    11/14/2017   **Appointment Provider:** Herbert Deaner, [facility] **CHN#:** [id number]    --- ---      **Supervising Provider:** [name], MD    ---         **Reason for Appointment**    ---       1\. Status post right hand sympathectomy, 10/28/17.    ---       **History of Present Illness**    ---     _GENERAL_ :    Follow-up. The patient is now approximately 2-1/2 week's status post the above  procedure. She has a history of stable scleroderma. Overall, she is doing  well. She has not noted significant change in her symptoms. She has been  working with hand therapy both here at [facility] as well as close to home.       **Current Medications**    ---    Taking     * IVIg 800 mg infusion Oral 2x a month    ---    * Macrobid 100 MG Capsule 1 capsule with food Orally every 12 hrs    ---    * Vitamin B Complex - Tablet Orally     ---    * Vitamin C Tablet Oral     ---    * Vitamin D 2000 U 1 tab every day    ---    * WP Thyroid 32.5 MG Tablet 1 tablet on an empty stomach Orally Once a day    ---    * Medication List reviewed and reconciled with the patient    ---       **Past Medical History**    ---       Systemic sclerosis.        ---       **Surgical History**    ---       Hand surgery-left elbow- rt hand calcium excision 06/23/2017    ---    Radical resection, heterotopic bone and capsule, left elbow 07/24/2017    ---    Sympathectomy radial and ulnar artery 10/28/2017    ---       **Family History**    ---       : unknown    ---    Father: diagnosed with Diabetes    ---    No autoimmune.    ---       **Social History**    ---    Work/Occupation: full-time Consulting civil engineer.     Alcohol  Yes but rarely.    Marital Status  Single.    Tobacco  history: Never smoked.      **Allergies**    ---       Keflex: Allergy    ---    Cephalexin    ---    gandolinium    ---       **Hospitalization/Major Diagnostic Procedure**    ---       No Hospitalization  History.    ---       **Review of Systems**    ---     _ORT_ :    Eyes No. Ear, Nose Throat No. Digestion, Stomach, Bowel No. Bladder Problems  No. Bleeding Problems No. Numbness/Tingling No. Anxiety/Depression No.  Fever/Chills/Fatigue No. Chest Pain/Tightness/Palpitations No. Skin Rash No.  Dental Problems No. Joint/Muscle Pain/Cramps Yes. Blackout/Fainting No. Other  No.          **Vital Signs**    ---    Pain scale 0, Ht-in 5'5, Ht-cm 152.4.       **Examination**    ---     _General Examination_ :    On examination today, she is a well-appearing pleasant female in no apparent  distress. On examination of her right upper extremity, surgical incisions are  all healing well without erythema, drainage or evidence of infection. Sutures  are in place. She has limited wrist and digital range of motion. Palpable  pulses at the wrist.           **Assessments**    ---    1\. Scleroderma - M34.9 (Primary)    ---    2\. Raynaud''s phenomenon without gangrene - I73.00    ---       **Treatment**    ---       **1\. Others**    Notes: The patient is now just over 2 weeks status post the above procedure.  Few sutures on her finger were removed today; however, the remainder were left  in place. She did experience some vasovagal symptoms during suture removal.  She will speak with her primary care provider regarding either some pain  medication or antianxiety medication prior to her follow-up next week. At that  point, hopefully the remainder of her sutures will look ready to be removed.  In the meantime, she will continue with occupational therapy working on her  wrist and digital range of motion. She will keep her incisions clean and dry.  In addition, she  inquired about whether or not she may continue phototherapy  for wound healing. Dr. Rodman Pickle feels that she may continue with this and a  message was left for her.    ---      **Follow Up**    ---    1 Week (Reason: suture removal)    **Appointment Provider:** Herbert Deaner, [facility]    Electronically signed by [name] , MD on 11/30/2017 at 10:58 AM EDT    Sign off status: Completed        * * *        [facility]    [address]    [place], [address]    [place], [place] [address]    Tel: [phone]    Fax: [phone]              * * *          Patient: [name] DOB: Mar 05, 1986 Progress Note: Herbert Deaner, [facility]  11/14/2017    ---    Note generated by eClinicalWorks EMR/PM Software ([email or web address])

## 2017-11-14 NOTE — Progress Notes (Signed)
.  Progress Notes  .  Patient: [name]  Provider: Herbert Deaner    .  DOB: July 18, 1985 Age: 32 Y Sex: Female  Supervising Provider:: [name], MD  Date: 11/14/2017  .  PCP: Md Cathrine Muster  MD  Date: 11/14/2017  .  --------------------------------------------------------------------------------  .  REASON FOR APPOINTMENT  .  1. Status post right hand sympathectomy, 10/28/17.  Marland Kitchen  HISTORY OF PRESENT ILLNESS  .  GENERAL:  Follow-up. The patient is now  approximately 2-1/2 week's status post the above procedure. She  has a history of stable scleroderma. Overall, she is doing well.  She has not noted significant change in her symptoms. She has  been working with hand therapy both here at [facility] as well as  close to home.  .  CURRENT MEDICATIONS  .  Taking IVIg 800 mg infusion Oral 2x a month  Taking Macrobid 100 MG Capsule 1 capsule with food Orally every  12 hrs  Taking Vitamin B Complex - Tablet Orally  Taking Vitamin C Tablet Oral  Taking Vitamin D 2000 U 1 tab every day  Taking [facility] Thyroid 32.5 MG Tablet 1 tablet on an empty stomach  Orally Once a day  Medication List reviewed and reconciled with the patient  .  PAST MEDICAL HISTORY  .  Systemic sclerosis  .  ALLERGIES  .  Keflex: Allergy  Cephalexin  gandolinium  .  SURGICAL HISTORY  .  Hand surgery-left elbow- rt hand calcium excision 06/23/2017  Radical resection, heterotopic bone and capsule, left elbow  07/24/2017  Sympathectomy radial and ulnar artery 10/28/2017  .  FAMILY HISTORY  .  : unknown  Father: diagnosed with Diabetes  No autoimmune.  .  SOCIAL HISTORY  .  Marland Kitchen  Work/Occupation: full-time Consulting civil engineer.  .  Alcohol Yes but rarely.  .  Marital Status Single.  .  Tobaccohistory:Never smoked.  Marland Kitchen  HOSPITALIZATION/MAJOR DIAGNOSTIC PROCEDURE  .  No Hospitalization History.  Marland Kitchen  REVIEW OF SYSTEMS  .  ORT:  .  Eyes    No . Ear, Nose Throat    No . Digestion, Stomach, Bowel     No . Bladder Problems    No . Bleeding Problems    No .  Numbness/Tingling    No .  Anxiety/Depression    No .  Fever/Chills/Fatigue    No . Chest Pain/Tightness/Palpitations     No . Skin Rash    No . Dental Problems    No . Joint/Muscle  Pain/Cramps    Yes . Blackout/Fainting    No . Other    No .  .  VITAL SIGNS  .  Pain scale 0, Ht-in 5'5, Ht-cm 152.4.  .  EXAMINATION  .  General Examination: On examination today, she is a  well-appearing pleasant female in no apparent distress. On  examination of her right upper extremity, surgical incisions are  all healing well without erythema, drainage or evidence of  infection. Sutures are in place. She has limited wrist and  digital range of motion. Palpable pulses at the wrist.  .  ASSESSMENTS  .  Scleroderma - M34.9 (Primary)  .  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  TREATMENT  .  Others  Notes: The patient is now just over 2 weeks status post the above  procedure. Few sutures on her finger were removed today; however,  the remainder were left in place. She did experience some  vasovagal symptoms during suture removal.  She will speak with her  primary care provider regarding either some pain medication or  antianxiety medication prior to her follow-up next week. At that  point, hopefully the remainder of her sutures will look ready to  be removed. In the meantime, she will continue with occupational  therapy working on her wrist and digital range of motion. She  will keep her incisions clean and dry. In addition, she inquired  about whether or not she may continue phototherapy for wound  healing. Dr. Rodman Pickle feels that she may continue with this and a  message was left for her.  .  FOLLOW UP  .  1 Week (Reason: suture removal)  .  Marland Kitchen  Appointment Provider: Herbert Deaner, [facility]  .  Electronically signed by [name] , MD on  11/30/2017 at 10:58 AM EDT  .  CONFIRMATORY SIGN OFF  .  Marland Kitchen  Document electronically signed by Herbert Deaner    .

## 2017-11-17 ENCOUNTER — Ambulatory Visit: Admitting: Hand Surgery

## 2017-12-08 ENCOUNTER — Ambulatory Visit: Admitting: Orthopaedic Surgery

## 2017-12-08 ENCOUNTER — Ambulatory Visit: Admitting: Hand Surgery

## 2017-12-08 NOTE — Progress Notes (Signed)
* * *        **Vanepps, Kalenna**    --- ---    32 Y old Female, DOB: [birth year], External MRN: [id number]    Account Number: [id number]    [address], [name]    Home: [phone]    Insurance: [employer]    PCP: [name], MD Referring: [name], MD    Appointment Facility: [facility]        * * *    12/08/2017   **Appointment Provider:** Gloris Manchester, MD **CHN#:** [phone]    --- ---      **Supervising Provider:** [name], MD    ---         **Reason for Appointment**    ---       1\. FU RT HAND    ---       **History of Present Illness**    ---     _GENERAL_ :    32 year old woman with a history of scleroderma s/p right hand sympathectomy  on 10/28/17. She reports she is pleased with her results thus far. She has  noticed a difference in the pain and color of her hands during vasospasms. She  is potentially interested in a similar procedure on the left in the near  future.       **Current Medications**    ---    Taking     * Diazepam 5 mg Tablet 1 tablet as needed Orally PRN    ---    * Gabapentin 100 mg Capsule 1 capsule Orally Once a day PRN    ---    * IVIg 800 mg infusion Oral 2x a month    ---    * Vitamin B Complex - Tablet Orally     ---    * Vitamin C Tablet Oral     ---    * Vitamin D 2000 U 1 tab every day    ---    Not-Taking/PRN    * Macrobid 100 MG Capsule 1 capsule with food Orally every 12 hrs    ---    * WP Thyroid 32.5 MG Tablet 1 tablet on an empty stomach Orally Once a day    ---       **Past Medical History**    ---       Systemic sclerosis.        ---       **Surgical History**    ---       Hand surgery-left elbow- rt hand calcium excision 06/23/2017    ---    Radical resection, heterotopic bone and capsule, left elbow 07/24/2017    ---    Sympathectomy radial and ulnar artery 10/28/2017    ---       **Family History**    ---       : unknown    ---    Father: diagnosed with Diabetes    ---    No autoimmune.    ---        **Social History**    ---    Work/Occupation: full-time Consulting civil engineer.    Alcohol  Yes but rarely.    Marital Status  Single.    Tobacco  history: Never smoked.      **Allergies**    ---       Keflex: Allergy    ---    Cephalexin    ---  gandolinium    ---       **Hospitalization/Major Diagnostic Procedure**    ---       No Hospitalization History.    ---       **Review of Systems**    ---     _ORT_ :    Eyes No. Ear, Nose Throat No. Digestion, Stomach, Bowel No. Bladder Problems  No. Bleeding Problems No. Numbness/Tingling No. Anxiety/Depression No.  Fever/Chills/Fatigue No. Chest Pain/Tightness/Palpitations No. Skin Rash No.  Dental Problems No. Joint/Muscle Pain/Cramps Yes. Blackout/Fainting No. Other  No.          **Vital Signs**    ---    Pain scale 0, Ht-in 5'5, Ht-cm 152.4.       **Physical Examination**    ---    Right wrist: s/p sympathectomy. Incisions are well healed. Mild TTP over  incisions. Limited wrist and finger motion due to postoperative pain. Pulses:  Palpable right radial and ulnar at wrist. Deformity right middle finger with  hooked nail and flexed DIP. skin intact.       **Assessments**    ---    1\. Scleroderma - M34.9 (Primary)    ---    2\. Raynaud''s phenomenon without gangrene - I73.00    ---       **Treatment**    ---       **1\. Scleroderma**    Notes: Patient seen and evaluated with Dr Rodman Pickle.        Ms Pepin is doing very well post-op from her right hand sympathectomy and  nodule excisions. She is interested in a similar procedure for the left side,  but would like to discuss an alternative therapy involving adipocyte stem  cells. We would like some time to investigate this treatment, and we contact  her afterwards to progress the plan. All questions and concerns were  addressed.    ---    **Appointment Provider:** Gloris Manchester, MD    Electronically signed by [name] , MD on 12/28/2017 at 07:03 PM EDT    Sign off status: Completed        * * *        [facility]    [address]    [place], [address]    [place], [place] [address]    Tel: [phone]    Fax: [phone]              * * *          Patient: TORIE, TOWLE DOB: 03/25/1986 Progress Note: Gloris Manchester, MD  12/08/2017    ---    Note generated by eClinicalWorks EMR/PM Software ([email or web address])

## 2017-12-08 NOTE — Progress Notes (Signed)
.  Progress Notes  .  Patient: [name]  Provider: Gloris Manchester    .  DOB: 03-17-1986 Age: 32 Y Sex: Female  Supervising Provider:: [name], MD  Date: 12/08/2017  .  PCP: Md Cathrine Muster  MD  Date: 12/08/2017  .  --------------------------------------------------------------------------------  .  REASON FOR APPOINTMENT  .  1. FU RT HAND  .  HISTORY OF PRESENT ILLNESS  .  GENERAL:   32 year old woman with a history of scleroderma s/p right hand  sympathectomy on 10/28/17. She reports she is pleased with her  results thus far. She has noticed a difference in the pain and  color of her hands during vasospasms. She is potentially  interested in a similar procedure on the left in the near future.  .  CURRENT MEDICATIONS  .  Taking Diazepam 5 mg Tablet 1 tablet as needed Orally PRN  Taking Gabapentin 100 mg Capsule 1 capsule Orally Once a day PRN  Taking IVIg 800 mg infusion Oral 2x a month  Taking Vitamin B Complex - Tablet Orally  Taking Vitamin C Tablet Oral  Taking Vitamin D 2000 U 1 tab every day  Not-Taking/PRN Macrobid 100 MG Capsule 1 capsule with food Orally  every 12 hrs  Not-Taking/PRN WP Thyroid 32.5 MG Tablet 1 tablet on an empty  stomach Orally Once a day  .  PAST MEDICAL HISTORY  .  Systemic sclerosis  .  ALLERGIES  .  Keflex: Allergy  Cephalexin  gandolinium  .  SURGICAL HISTORY  .  Hand surgery-left elbow- rt hand calcium excision 06/23/2017  Radical resection, heterotopic bone and capsule, left elbow  07/24/2017  Sympathectomy radial and ulnar artery 10/28/2017  .  FAMILY HISTORY  .  : unknown  Father: diagnosed with Diabetes  No autoimmune.  .  SOCIAL HISTORY  .  Marland Kitchen  Work/Occupation: full-time Consulting civil engineer.  .  Alcohol Yes but rarely.  .  Marital Status Single.  .  Tobaccohistory:Never smoked.  Marland Kitchen  HOSPITALIZATION/MAJOR DIAGNOSTIC PROCEDURE  .  No Hospitalization History.  Marland Kitchen  REVIEW OF SYSTEMS  .  ORT:  .  Eyes    No . Ear, Nose Throat    No . Digestion, Stomach, Bowel     No . Bladder Problems     No . Bleeding Problems    No .  Numbness/Tingling    No . Anxiety/Depression    No .  Fever/Chills/Fatigue    No . Chest Pain/Tightness/Palpitations     No . Skin Rash    No . Dental Problems    No . Joint/Muscle  Pain/Cramps    Yes . Blackout/Fainting    No . Other    No .  .  VITAL SIGNS  .  Pain scale 0, Ht-in 5'5, Ht-cm 152.4.  .  PHYSICAL EXAMINATION  .  Right wrist: s/p sympathectomy. Incisions are well healed. Mild  TTP over incisions. Limited wrist and finger motion due to  postoperative pain. Pulses: Palpable right radial and ulnar at  wrist. Deformity right middle finger with hooked nail and flexed  DIP. skin intact.  .  ASSESSMENTS  .  Scleroderma - M34.9 (Primary)  .  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  TREATMENT  .  Scleroderma  Notes: Patient seen and evaluated with Dr Rodman Pickle.Ms Saling is  doing very well post-op from her right hand sympathectomy and  nodule excisions. She is interested in a similar procedure for  the left side,  but would like to discuss an alternative therapy  involving adipocyte stem cells. We would like some time to  investigate this treatment, and we contact her afterwards to  progress the plan. All questions and concerns were addressed.  .  .  Appointment Provider: Gloris Manchester, MD  .  Electronically signed by [name] , MD on  12/28/2017 at 07:03 PM EDT  .  CONFIRMATORY SIGN OFF  .  Marland Kitchen  Document electronically signed by Gloris Manchester    .

## 2017-12-19 ENCOUNTER — Ambulatory Visit: Admitting: Hand Surgery

## 2017-12-19 NOTE — Progress Notes (Signed)
* * *        **  [name], [name]**    --- ---    1 Y old Female, DOB: 03/11/1986    [address], [place], [place] [address]    Home: [phone]    Provider: [name]        * * *    Telephone Encounter    ---    Answered by   Loel Lofty  Date: 12/19/2017         Time: 03:34 PM    Caller   patient    --- ---            Reason   surgery            Message                      Patient is calling to schedule surgery with Dr. Rodman Pickle. Contact Amauria at [phone], thank you.                Action Taken                      Benoit,Kyara  12/19/2017 3:37:10 PM >       Leighton Roach  12/22/2017 11:48:11 AM > hello pt is calling and would like to move date of surgery if possible?She would like a call back to discuss this with you      yhanks      [name],[name]  12/22/2017 1:06:18 PM > spoke to patient. I am unable to move up her surgery at this time. She is scheduled for 01/14/18                          * * *                ---          * * *          Patient: [name], [name] DOB: 1985/04/04 Provider: [name] 12/19/2017    ---    Note generated by eClinicalWorks EMR/PM Software ([email or web address])

## 2018-01-13 ENCOUNTER — Ambulatory Visit: Admitting: Hand Surgery

## 2018-01-13 NOTE — Progress Notes (Signed)
* * *        **  [name], [name]**    --- ---    59 Y old Female, DOB: 15-Oct-1985    [address], [place], [place] [address]    Home: [phone]    Provider: [name]        * * *    Telephone Encounter    ---    Answered by   Magdalene Patricia  Date: 01/13/2018         Time: 09:21 AM    Caller   Pt.    --- ---            Reason   Surgery Time            Message                      Good Morning,             Pt. stated that she has surgery tomorrow and she do not know the time of the surgery. Pt. can be reached at, [phone]  and is OK to LVM.             Thank you,                 Action Taken                      [facility]  01/13/2018 9:23:57 AM >       [name],[name]  01/13/2018 4:16:18 PM > Emailed her with information.                    * * *                ---          * * *          Patient: [name], [name] DOB: 06/04/1985 Provider: [name] 01/13/2018    ---    Note generated by eClinicalWorks EMR/PM Software ([email or web address])

## 2018-01-14 ENCOUNTER — Ambulatory Visit: Admitting: Hand Surgery

## 2018-01-14 LAB — HX BF-URINALYSIS: HX URINE PREGNANCY TEST (HCG QUAL): NEGATIVE

## 2018-01-14 NOTE — Op Note (Signed)
Patient    [name], [name]              Med Rec #:  [id number]  Name:  Operation  01/14/2018                Pt.  Dt:                                  Location:  .  Marland Kitchen                               OPERATIVE REPORT  .  Marland Kitchen  PREOPERATIVE DIAGNOSES:  1.  Sjogren syndrome.  2.  Raynaud syndrome, left hand.  3.  Calcinosis, left small finger.  Marland Kitchen  POSTOPERATIVE DIAGNOSES:  1.  Sjogren syndrome.  2.  Raynaud syndrome, left hand.  3.  Calcinosis, left small finger.  Marland Kitchen  PROCEDURE:  1.  Sympathectomy, left superficial palmar arch.  2.  Sympathectomy, left radial artery.  3.  Sympathectomy, left ulnar artery.  4.  Sympathectomy of common digital artery, left index/middle.  5.  Sympathectomy of common digital artery, left middle/ring.  6.  Sympathectomy of common digital artery, left ring/small.  7.  Excision of calcinosis deep, left small finger.  .  SURGEON:  [name], M.D.  .  ASSISTANT:  Jackalyn Lombard, M.D., and Vivi Barrack, M.D.  .  ANESTHESIA:  General.  .  COMPLICATIONS:  None.  .  ESTIMATED BLOOD LOSS:  Minimal.  .  TOURNIQUET TIME:  Approximately 2 hours.  .  INDICATIONS:  She has Sjogren's syndrome with severe manifestations  including severe Raynaud, cold intolerance, deformity and calcinosis.  She  is status post a successful right sympathectomy and would like to proceed  with surgery on the left.  She also has calcinosis of her left small finger  pulp that has drained intermittently and she would like to have this  excised.  Risks of surgery including, but not limited to infection, nerve  damage, stiffness, and failure to achieve lasting improvement in perfusion  and recurrence of the calcinosis were explained to the patient  preoperatively, who wished to proceed.  This is an unrelated procedure on  the left hand during the global period following right upper extremity  surgery.  .  DESCRIPTION OF PROCEDURE:  Following adequate anesthesia, the patient was  placed in supine position on the operating table.   Left upper extremity was  prepped and draped in a sterile fashion.  The limb was exsanguinated by  gravity and a proximal arm tourniquet elevated to 230 mmHg.  Attention was  directed to the hand.  An oblique incision was made along the level of the  proximal palmar crease, curving this proximally over the region on Guyon's  canal.  Flaps were elevated.  The superficial palmar fascia was divided and  the superficial palmar arch was identified.  .  Next, a longitudinal incision was made overlying the volar aspect of the  radial artery and the ulnar artery, and an oblique incision was made in the  snuffbox overlying the deep radial artery.  Careful dissection was carried  through the subcutaneous tissue.  Hemostasis was achieved with bipolar  electrocautery.  The vessels were identified and vessel loops were placed  around.  .  Next, the microscope was brought in.  Next, a sympathectomy of the  superficial palmar  arch was then performed, dividing branches from the  digital nerves to the arch, as well as performing an adventitial stripping  under the operating microscope.  The common digital arteries to the index,  middle, ring, and small fingers were then addressed, dividing the digital  nerve branches to the arteries and then performing adventitial stripping of  the arteries with care to protect the arteries themselves throughout the  procedure.  Care was taken to protect the motor branches to the lumbricals  as well.  .  Next, a sympathectomy of the radial artery volarly was performed at the  wrist level, performing adventitial stripping for a distance of 2 cm.  .  Next, a sympathectomy of the ulnar artery was performed at the wrist level  by performing adventitial stripping of the ulnar artery for a distance of 2  cm.  .  Next, a sympathectomy of the deep radial artery was performed, performing  an adventitial stripping of a distance of 2 cm.  All were performed under  the operating microscope.  .  Next, a  longitudinal incision was made at the pulp of the left small  finger.  The deep dermis was elevated from the calcinosis.  The calcinosis  extended down to and involved the distal phalanx of the left small finger.  The calcinosis was meticulously debrided with care to protect the digital  neurovascular bundles.  The calcinosis was removed.  The tourniquet was  released.  The fingers pinked immediately with good capillary refill.  Local infiltration was performed using 0.25% Marcaine.  The wounds were  irrigated copiously.  The skin was approximated with 5-0 nylon interrupted  sutures.  Sterile dressings were applied.  The patient tolerated the  procedure well and was discharged to the recovery room in good condition.  .  .  .  Electronically Signed  [name], MD 01/27/2018 06:49 A  .  .  Marland Kitchen  Dictated byYetta Numbers, MD  .  D:    01/17/2018  T:    01/17/2018 08:28 A  Dictation ID:  [id number]/Doc#  [id number]  .  cc:  .  Marland Kitchen      Document is preliminary until electronically or manually signed by                             attending physician.

## 2018-01-19 ENCOUNTER — Ambulatory Visit

## 2018-01-21 ENCOUNTER — Ambulatory Visit

## 2018-01-26 ENCOUNTER — Ambulatory Visit: Admitting: Hand Surgery

## 2018-01-26 NOTE — Progress Notes (Signed)
* * *        **[name], [name]**    --- ---    23 Y old Female, DOB: [birth year], External MRN: [id number]    Account Number: [id number]    [address], [name]    Home: [phone]    Insurance: [employer]    PCP: [name], MD Referring: [name], MD    [facility]        * * *    01/26/2018  Progress Notes: [name], MD **CHN#:** [id number]    --- ---    ---         **Reason for Appointment**    ---       1\. POST OP PER IAN    ---       **History of Present Illness**    ---     _GENERAL_ :    hand is feeling better. on right, middle finger calcinosis is worsening as  well as radial DIP small finger. On left, she wonders whether something can be  done to correct small finger PIP contracture.       **Current Medications**    ---    Taking     * Diazepam 5 mg Tablet 1 tablet as needed Orally PRN    ---    * Gabapentin 100 mg Capsule 1 capsule Orally Once a day PRN    ---    * IVIg 800 mg infusion Oral 2x a month    ---    * Vitamin B Complex - Tablet Orally     ---    * Vitamin C Tablet Oral     ---    * Vitamin D 2000 U 1 tab every day    ---    Not-Taking/PRN    * Macrobid 100 MG Capsule 1 capsule with food Orally every 12 hrs    ---    * WP Thyroid 32.5 MG Tablet 1 tablet on an empty stomach Orally Once a day    ---    * Medication List reviewed and reconciled with the patient    ---       **Past Medical History**    ---       Systemic sclerosis.        ---       **Surgical History**    ---       Hand surgery-left elbow- rt hand calcium excision 06/23/2017    ---    Radical resection, heterotopic bone and capsule, left elbow 07/24/2017    ---    Sympathectomy radial and ulnar artery 10/28/2017    ---    Left Hand Surgery 01/14/2018    ---       **Family History**    ---       : unknown    ---    Father: diagnosed with Diabetes    ---    No autoimmune.    ---       **Social History**    ---    Work/Occupation: full-time Consulting civil engineer.    Alcohol   Yes but rarely.    Marital Status  Single.    Tobacco  history: Never smoked.      **Allergies**    ---       Keflex: Allergy    ---    Cephalexin    ---    gandolinium    ---       **  Hospitalization/Major Diagnostic Procedure**    ---       No Hospitalization History.    ---       **Review of Systems**    ---     _ORT_ :    Eyes No. Ear, Nose Throat No. Digestion, Stomach, Bowel No. Bladder Problems  No. Bleeding Problems No. Numbness/Tingling No. Anxiety/Depression No.  Fever/Chills/Fatigue No. Chest Pain/Tightness/Palpitations No. Skin Rash No.  Dental Problems No. Joint/Muscle Pain/Cramps Yes. Blackout/Fainting No. Other  No.          **Vital Signs**    ---    Pain scale 0, Ht-in 5'5, Ht-cm 152.4.       **Physical Examination**    ---    appears healthy. left hand incisions healing without signs of infection, PIP  small finger rests in 60 degrees flexion, correctable to 40. on right,  defmrmity of pulp/nail of midlle finger with firm calcinosis. small finger  calcinosis radial side.       **Assessments**    ---    1\. Scleroderma - M34.9 (Primary)    ---    2\. Raynaud''s phenomenon without gangrene - I73.00    ---       **Treatment**    ---       **1\. Scleroderma**    Notes: most of sutures removed today, leaving palm sutures in place. she will  have them removed on Friday. nature of PIP contracture in scleroderma  reviewed. I explained that surgery to straighten is complex and would likely  result in loss of ability to make fist. we will try serial casting. also given  Rx for resting right hand splint. she would liketo proceed with calcinosis  excision for right middle and small fingers. risks of surgery, including but  not limited to infection,ntip necrosis, wound healing problems, recurrence  explained. i explained that the nail deformity is duee to bone loss and is not  expected to resiolve. she would like to proceed.    ---      **Follow Up**    ---    friday    Electronically signed by [name] ,  MD on 01/27/2018 at 06:36 AM EDT    Sign off status: Completed        * * *        [facility]    [address]    [place], [place] [address]    Tel: [phone]    Fax: [phone]              * * *          Patient: [name], [name] DOB: [birth year] Progress Note: [name], MD  01/26/2018    ---    Note generated by eClinicalWorks EMR/PM Software ([email or web address])

## 2018-01-26 NOTE — Progress Notes (Signed)
.  Progress Notes  .  Patient: [name], [name]  Provider: [name]    .  DOB: 1985-08-16 Age: 32 Y Sex: Female  .  PCP: Md Cathrine Muster  MD  Date: 01/26/2018  .  --------------------------------------------------------------------------------  .  REASON FOR APPOINTMENT  .  1. POST OP PER IAN  .  HISTORY OF PRESENT ILLNESS  .  GENERAL:   hand is feeling better. on right, middle finger calcinosis is  worsening as well as radial DIP small finger. On left, she  wonders whether something can be done to correct small finger PIP  contracture.  .  CURRENT MEDICATIONS  .  Taking Diazepam 5 mg Tablet 1 tablet as needed Orally PRN  Taking Gabapentin 100 mg Capsule 1 capsule Orally Once a day PRN  Taking IVIg 800 mg infusion Oral 2x a month  Taking Vitamin B Complex - Tablet Orally  Taking Vitamin C Tablet Oral  Taking Vitamin D 2000 U 1 tab every day  Not-Taking/PRN Macrobid 100 MG Capsule 1 capsule with food Orally  every 12 hrs  Not-Taking/PRN WP Thyroid 32.5 MG Tablet 1 tablet on an empty  stomach Orally Once a day  Medication List reviewed and reconciled with the patient  .  PAST MEDICAL HISTORY  .  Systemic sclerosis  .  ALLERGIES  .  Keflex: Allergy  Cephalexin  gandolinium  .  SURGICAL HISTORY  .  Hand surgery-left elbow- rt hand calcium excision 06/23/2017  Radical resection, heterotopic bone and capsule, left elbow  07/24/2017  Sympathectomy radial and ulnar artery 10/28/2017  Left Hand Surgery 01/14/2018  .  FAMILY HISTORY  .  : unknown  Father: diagnosed with Diabetes  No autoimmune.  .  SOCIAL HISTORY  .  Marland Kitchen  Work/Occupation: full-time Consulting civil engineer.  .  Alcohol Yes but rarely.  .  Marital Status Single.  .  Tobaccohistory:Never smoked.  Marland Kitchen  HOSPITALIZATION/MAJOR DIAGNOSTIC PROCEDURE  .  No Hospitalization History.  Marland Kitchen  REVIEW OF SYSTEMS  .  ORT:  .  Eyes    No . Ear, Nose Throat    No . Digestion, Stomach, Bowel     No . Bladder Problems    No . Bleeding Problems    No .  Numbness/Tingling    No .  Anxiety/Depression    No .  Fever/Chills/Fatigue    No . Chest Pain/Tightness/Palpitations     No . Skin Rash    No . Dental Problems    No . Joint/Muscle  Pain/Cramps    Yes . Blackout/Fainting    No . Other    No .  .  VITAL SIGNS  .  Pain scale 0, Ht-in 5'5, Ht-cm 152.4.  .  PHYSICAL EXAMINATION  .  appears healthy. left hand incisions healing without signs of  infection, PIP small finger rests in 60 degrees flexion,  correctable to 40. on right, defmrmity of pulp/nail of midlle  finger with firm calcinosis. small finger calcinosis radial side.  .  ASSESSMENTS  .  Scleroderma - M34.9 (Primary)  .  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  TREATMENT  .  Scleroderma  Notes: most of sutures removed today, leaving palm sutures in  place. she will have them removed on Friday. nature of PIP  contracture in scleroderma reviewed. I explained that surgery to  straighten is complex and would likely result in loss of ability  to make fist. we will try serial casting. also given Rx for  resting right hand splint. she would liketo proceed with  calcinosis excision for right middle and small fingers. risks of  surgery, including but not limited to infection,ntip necrosis,  wound healing problems, recurrence explained. i explained that  the nail deformity is duee to bone loss and is not expected to  resiolve. she would like to proceed.  .  FOLLOW UP  .  friday  .  Electronically signed by [name] , MD on  01/27/2018 at 06:36 AM EDT  .  Document electronically signed by [name]    .

## 2018-02-02 ENCOUNTER — Ambulatory Visit: Admitting: Hand Surgery

## 2018-02-02 ENCOUNTER — Ambulatory Visit: Admitting: Orthopaedic Surgery

## 2018-02-02 NOTE — Progress Notes (Signed)
* * *        **[name], [name]**    --- ---    89 Y old Female, DOB: 1985-06-06, External MRN: [id number]    Account Number: [id number]    [address], [name]    Home: [phone]    Insurance: [employer]    PCP: [name], MD Referring: [name], MD    [facility]        * * *    02/02/2018   **Appointment Provider:** Dorian Pod **CHN#:** [id number]    --- ---      **Supervising Provider:** [name], MD    ---         **Reason for Appointment**    ---       1\. 01/14/18 S/P SYMPATHECTOMY, LEFT SUPERFICIAL ARCH    ---    2\. SYMPATHECTOMY, LEFT RADIAL AND ULNAR ARTERY    ---    3\. SYMPATHECTOMY OF COMMON DIGITAL ARTERY, LEFT IF/MF/RF    ---    4\. EXCISION OF CALCINOSIS DEEP, LEFT SF    ---       **History of Present Illness**    ---     _GENERAL_ :    32yo female is now 2.5 weeks post left hand surgery. She has been compliant  with her restrictions and instructions. On right, middle finger calcinosis is  worsening as well as radial DIP small finger, and will like to plan surgery  today. She denies fevers, chills or other issues at this time.       **Current Medications**    ---    Taking     * Gabapentin 100 mg Capsule 1 capsule Orally Once a day PRN    ---    * IVIg 800 mg infusion Oral 2x a month    ---    * Vitamin B Complex - Tablet Orally     ---    * Vitamin C Tablet Oral     ---    * Vitamin D 2000 U 1 tab every day    ---    Not-Taking/PRN    * Diazepam 5 mg Tablet 1 tablet as needed Orally PRN    ---    * Macrobid 100 MG Capsule 1 capsule with food Orally every 12 hrs    ---    * WP Thyroid 32.5 MG Tablet 1 tablet on an empty stomach Orally Once a day    ---    * Medication List reviewed and reconciled with the patient    ---       **Past Medical History**    ---       Systemic sclerosis.        ---       **Surgical History**    ---       Hand surgery-left elbow- rt hand calcium excision 06/23/2017    ---    Radical  resection, heterotopic bone and capsule, left elbow 07/24/2017    ---    Sympathectomy radial and ulnar artery 10/28/2017    ---    Left Hand Surgery 01/14/2018    ---       **Family History**    ---       : unknown    ---    Father: diagnosed with Diabetes    ---    No autoimmune.    ---       **Social  History**    ---    Work/Occupation: full-time Consulting civil engineer.    Alcohol  Yes but rarely.    Marital Status  Single.    Tobacco  history: Never smoked.      **Allergies**    ---       Keflex: Allergy    ---    Cephalexin    ---    gandolinium    ---       **Hospitalization/Major Diagnostic Procedure**    ---       No Hospitalization History.    ---       **Review of Systems**    ---     _ORT_ :    Eyes No. Ear, Nose Throat No. Digestion, Stomach, Bowel No. Bladder Problems  No. Bleeding Problems No. Numbness/Tingling No. Anxiety/Depression No.  Fever/Chills/Fatigue No. Chest Pain/Tightness/Palpitations No. Skin Rash No.  Dental Problems No. Joint/Muscle Pain/Cramps Yes. Blackout/Fainting No. Other  No.          **Vital Signs**    ---    Pain scale 0, Ht-in 5'5, Ht-cm 152.4.       **Physical Examination**    ---    31yo female, well-appearing, in no acute distress. Exam of the left hand  incisions healing without signs of infection, PIP small finger rests in 60  degrees flexion, correctable to 40.    Exam of right, defmrmity of pulp/nail of midlle finger with firm calcinosis.  small finger calcinosis radial side.       **Assessments**    ---    1\. Scleroderma - M34.9 (Primary)    ---    2\. Raynaud''s phenomenon without gangrene - I73.00    ---       **Treatment**    ---       **1\. Scleroderma**    Notes: The patient was seen and evalauted with Dr Rodman Pickle. She is now 2.5  weeks post the aforementioned surgeries and doing well. The reamaining sutures  were removed and replaced with steri-strips. She may get the incision wet. She  will continue OT working on ROM. In regards the RUE, We will schedule a  surgical excision of  calcinosis of the MF and SF. Risks of surgery were  discussed including bleeding, infection, damage to nearby vessels, nerves,  structures, stiffness, intraop fracture, need for future procedures, and  incomplete relief of symptoms. The patient expressed understanding of these  risks and wished to proceed. All questions were answered.    ---      **Follow Up**    ---    surgery    **Appointment Provider:** Dorian Pod    Electronically signed by [name] , MD on 02/27/2018 at 09:31 AM EST    Sign off status: Completed        * * *        [facility]    [address]    [place], [address]    [place], [place] [address]    Tel: [phone]    Fax: [phone]              * * *          Patient: [name], [name] DOB: Apr 01, 1986 Progress Note: Dorian Pod 02/02/2018    ---    Note generated by eClinicalWorks EMR/PM Software ([email or web address])

## 2018-02-02 NOTE — Progress Notes (Signed)
.  Progress Notes  .  Patient: [name], [name]  Provider: Dorian Pod    .  DOB: 1985/08/03 Age: 32 Y Sex: Female  Supervising Provider:: [name], MD  Date: 02/02/2018  .  PCP: Md Cathrine Muster  MD  Date: 02/02/2018  .  --------------------------------------------------------------------------------  .  REASON FOR APPOINTMENT  .  1. 01/14/18 S/P SYMPATHECTOMY, LEFT SUPERFICIAL ARCH  .  2. SYMPATHECTOMY, LEFT RADIAL AND ULNAR ARTERY  .  3. SYMPATHECTOMY OF COMMON DIGITAL ARTERY, LEFT IF/MF/RF  .  4. EXCISION OF CALCINOSIS DEEP, LEFT SF  .  HISTORY OF PRESENT ILLNESS  .  GENERAL:   32yo female is now 2.5 weeks post left hand surgery. She has  been compliant with her restrictions and instructions. On right,  middle finger calcinosis is worsening as well as radial DIP small  finger, and will like to plan surgery today. She denies fevers,  chills or other issues at this time.  .  CURRENT MEDICATIONS  .  Taking Gabapentin 100 mg Capsule 1 capsule Orally Once a day PRN  Taking IVIg 800 mg infusion Oral 2x a month  Taking Vitamin B Complex - Tablet Orally  Taking Vitamin C Tablet Oral  Taking Vitamin D 2000 U 1 tab every day  Not-Taking/PRN Diazepam 5 mg Tablet 1 tablet as needed Orally PRN  Not-Taking/PRN Macrobid 100 MG Capsule 1 capsule with food Orally  every 12 hrs  Not-Taking/PRN WP Thyroid 32.5 MG Tablet 1 tablet on an empty  stomach Orally Once a day  Medication List reviewed and reconciled with the patient  .  PAST MEDICAL HISTORY  .  Systemic sclerosis  .  ALLERGIES  .  Keflex: Allergy  Cephalexin  gandolinium  .  SURGICAL HISTORY  .  Hand surgery-left elbow- rt hand calcium excision 06/23/2017  Radical resection, heterotopic bone and capsule, left elbow  07/24/2017  Sympathectomy radial and ulnar artery 10/28/2017  Left Hand Surgery 01/14/2018  .  FAMILY HISTORY  .  : unknown  Father: diagnosed with Diabetes  No autoimmune.  .  SOCIAL HISTORY  .  Marland Kitchen  Work/Occupation: full-time Consulting civil engineer.  .  Alcohol Yes  but rarely.  .  Marital Status Single.  .  Tobaccohistory:Never smoked.  Marland Kitchen  HOSPITALIZATION/MAJOR DIAGNOSTIC PROCEDURE  .  No Hospitalization History.  Marland Kitchen  REVIEW OF SYSTEMS  .  ORT:  .  Eyes    No . Ear, Nose Throat    No . Digestion, Stomach, Bowel     No . Bladder Problems    No . Bleeding Problems    No .  Numbness/Tingling    No . Anxiety/Depression    No .  Fever/Chills/Fatigue    No . Chest Pain/Tightness/Palpitations     No . Skin Rash    No . Dental Problems    No . Joint/Muscle  Pain/Cramps    Yes . Blackout/Fainting    No . Other    No .  .  VITAL SIGNS  .  Pain scale 0, Ht-in 5'5, Ht-cm 152.4.  .  PHYSICAL EXAMINATION  .  31yo female, well-appearing, in no acute distress. Exam of the  left hand incisions healing without signs of infection, PIP small  finger rests in 60 degrees flexion, correctable to 40. Exam of  right, defmrmity of pulp/nail of midlle finger with firm  calcinosis. small finger calcinosis radial side.  .  ASSESSMENTS  .  Scleroderma - M34.9 (Primary)  .  Raynaud''s phenomenon without  gangrene - I73.00  .  TREATMENT  .  Scleroderma  Notes: The patient was seen and evalauted with Dr Rodman Pickle. She is  now 2.5 weeks post the aforementioned surgeries and doing well.  The reamaining sutures were removed and replaced with  steri-strips. She may get the incision wet. She will continue OT  working on ROM. In regards the RUE, We will schedule a surgical  excision of calcinosis of the MF and SF. Risks of surgery were  discussed including bleeding, infection, damage to nearby  vessels, nerves, structures, stiffness, intraop fracture, need  for future procedures, and incomplete relief of symptoms. The  patient expressed understanding of these risks and wished to  proceed. All questions were answered.  .  FOLLOW UP  .  surgery  .  Marland Kitchen  Appointment Provider: Dorian Pod  .  Electronically signed by [name] , MD on  02/27/2018 at 09:31 AM EST  .  CONFIRMATORY SIGN OFF  .  Marland Kitchen  Document electronically  signed by Dorian Pod    .

## 2018-03-11 ENCOUNTER — Ambulatory Visit: Admitting: Hand Surgery

## 2018-03-11 LAB — HX BF-URINALYSIS: HX URINE PREGNANCY TEST (HCG QUAL): NEGATIVE

## 2018-03-11 NOTE — Op Note (Signed)
Patient    [name], [name]              Med Rec #:  [id number]  Name:  Operation  03/11/2018                Pt.  Dt:                                  Location:  .  Marland Kitchen                               OPERATIVE REPORT  .  Marland Kitchen  PREOPERATIVE DIAGNOSES:  1.  Calcinosis, right middle finger.  2.  Calcinosis, right small finger.  Marland Kitchen  POSTOPERATIVE DIAGNOSES:  1.  Calcinosis, right middle finger.  2.  Calcinosis, right small finger.  Marland Kitchen  PROCEDURES:  1.  Excision of calcinosis, right middle finger.  2.  Excision of calcinosis, right small finger.  .  SURGEON:  [name], MD  .  ASSISTANT:  Milinda Cave, MD  .  ANESTHESIA:  Sedation.  .  COMPLICATION:  None.  .  ESTIMATED BLOOD LOSS:  Minimal.  .  TOURNIQUET TIME:  Less than 2 hours.  .  INDICATIONS:  This is a planned return to the operating room during the  global period following hand surgery.  She has severe calcinosis of her  right middle finger resulting in deformity.  Essentially the entire pulp of  the finger is filled with calcium deposit.  She has a hook nail and a  shortened distal phalanx, with intermittent drainage.  She would like to  have the calcinosis removed.  We discussed the option of an amputation.  However, we would like to preserve the digit if at all possible.  In  addition, she has calcinosis along the radial aspect of the right small  finger that has drained intermittently.  She would like to have this  excised as well.  Risks of surgery including, but not limited to infection,  nerve damage, stiffness, recurrence, tissue loss and incomplete symptom  relief were explained to the patient preoperatively who wished to proceed.  .  DESCRIPTION OF THE PROCEDURE:  Following adequate anesthesia, the patient  was placed in the supine position on the operating table.  The right upper  extremity was prepped and draped in sterile fashion.  Webspace blocks were  performed using 2% mepivacaine.  The limb was exsanguinated using an  Esmarch and a proximal forearm  tourniquet elevated to 230 mmHg.  A  longitudinal incision was made over the volar aspect of the pulp of the  right middle finger.  The skin was quite thin and was carefully elevated  from the calcinosis, which extended down to the level of the bone along  both the radial and ulnar sides.  The incision was extended proximally  obliquely and the radial and ulnar digital neurovascular bundles were  identified and protected.  The calcinosis was excised with care to protect  the flexor tendon.  Fluoroscopy confirmed appropriate removal of the  calcium deposits.  .  Next, an incision was made along the radial aspect of the right small  finger at the distal interphalangeal joint level.  Careful dissection was  carried through the subcutaneous tissue.  Hemostasis was achieved with  bipolar electrocautery.  The calcium deposits were identified and were  removed from the  surface of the radial collateral ligament.  The wounds  were irrigated copiously.  Hemostasis was achieved with bipolar  electrocautery following tourniquet removal.  The skin was approximated  with 5-0 nylon interrupted sutures.  Sterile dressings were applied.  The  patient tolerated the procedure well and was discharged to the recovery  room in good condition.  .  .  .  Electronically Signed  [name], MD 04/13/2018 08:23 A  .  .  Marland Kitchen  Dictated byYetta Numbers, MD  .  D:    04/07/2018  T:    04/07/2018 08:24 A  Dictation ID:  [id number]/Doc#  [id number]  .  cc:  .  Marland Kitchen      Document is preliminary until electronically or manually signed by                             attending physician.

## 2018-03-27 ENCOUNTER — Ambulatory Visit: Admitting: Hand Surgery

## 2018-03-27 NOTE — Progress Notes (Signed)
* * *        **[name], [name]**    --- ---    42 Y old Female, DOB: 14-Aug-1985, External MRN: [id number]    Account Number: [id number]    [address] [place]    Home: [phone]    Insurance: [employer]    PCP: [name], MD Referring: [name], MD    [facility] Facility[facility]        * * *    03/27/2018  Progress Notes: [name], MD **CHN#:** [phone]    --- ---    ---         **Reason for Appointment**    ---       1\. Follow-up    ---       **History of Present Illness**    ---     _GENERAL_ :    minimal pain. cold intolerance improved. would like small finger PIP  contractures addressed.       **Current Medications**    ---    Taking     * Gabapentin 100 mg Capsule 1 capsule Orally Once a day PRN    ---    * IVIg 800 mg infusion Oral 2x a month    ---    * Vitamin B Complex - Tablet Orally     ---    * Vitamin C Tablet Oral     ---    * Vitamin D 2000 U 1 tab every day    ---    Not-Taking/PRN    * Diazepam 5 mg Tablet 1 tablet as needed Orally PRN    ---    * Macrobid 100 MG Capsule 1 capsule with food Orally every 12 hrs    ---    * WP Thyroid 32.5 MG Tablet 1 tablet on an empty stomach Orally Once a day    ---    * Medication List reviewed and reconciled with the patient    ---       **Past Medical History**    ---       Systemic sclerosis.        ---       **Surgical History**    ---       Hand surgery-left elbow- rt hand calcium excision 06/23/2017    ---    Radical resection, heterotopic bone and capsule, left elbow 07/24/2017    ---    Sympathectomy radial and ulnar artery 10/28/2017    ---    Left Hand Surgery 01/14/2018    ---       **Family History**    ---       : unknown    ---    Father: diagnosed with Diabetes mellitus without mention of complication, type  II or unspecified type, not stated as uncontrolled    ---    No autoimmune.    ---       **Social History**    ---    Work/Occupation: full-time Consulting civil engineer.    Alcohol   Yes but rarely.    Marital Status  Single.    Tobacco  history: Never smoked.      **Allergies**    ---       Keflex: Allergy    ---    Cephalexin    ---    gandolinium    ---       **Hospitalization/Major Diagnostic Procedure**    ---  Denies Past Hospitalization    ---       **Review of Systems**    ---     _ORT_ :    Eyes No. Ear, Nose Throat No. Digestion, Stomach, Bowel No. Bladder Problems  No. Bleeding Problems No. Numbness/Tingling No. Anxiety/Depression No.  Fever/Chills/Fatigue No. Chest Pain/Tightness/Palpitations No. Skin Rash No.  Dental Problems No. Joint/Muscle Pain/Cramps No. Blackout/Fainting No. Other  No.          **Vital Signs**    ---    Pain scale 0, Ht-in 5'5, Ht-cm 152.4.       **Physical Examination**    ---    appears healthy. incisions well healed. PIP contractures bilat small fingers.  good cap refill.       **Assessments**    ---    1\. Scleroderma - M34.9 (Primary)    ---    2\. 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 Scleroderma**    Notes: sutures removed. to be seen by OT for serial casting.    ---      **Follow Up**    ---    4 Weeks    Electronically signed by [name] , MD on 04/04/2018 at 06:48 AM EST    Sign off status: Completed        * * *        [facility]    [address]    [place], [address]    [place], [place] [address]    Tel: [phone]    Fax: [phone]              * * *          Patient: [name], [name] DOB: 01-19-1986 Progress Note: [name], MD  03/27/2018    ---    Note generated by eClinicalWorks EMR/PM Software ([email or web address])

## 2018-03-27 NOTE — Progress Notes (Signed)
.    Progress Notes  .  Patient: [name], [name]  Provider: [name]    .  DOB: [birth year] Age: 32 Y Sex: Female  .  PCP: Md Cathrine Muster  MD  Date: 03/27/2018  .  --------------------------------------------------------------------------------  .  REASON FOR APPOINTMENT  .  1. Follow-up  .  HISTORY OF PRESENT ILLNESS  .  GENERAL:   minimal pain. cold intolerance improved. would like small finger  PIP contractures addressed.  .  CURRENT MEDICATIONS  .  Taking Gabapentin 100 mg Capsule 1 capsule Orally Once a day PRN  Taking IVIg 800 mg infusion Oral 2x a month  Taking Vitamin B Complex - Tablet Orally  Taking Vitamin C Tablet Oral  Taking Vitamin D 2000 U 1 tab every day  Not-Taking/PRN Diazepam 5 mg Tablet 1 tablet as needed Orally PRN  Not-Taking/PRN Macrobid 100 MG Capsule 1 capsule with food Orally  every 12 hrs  Not-Taking/PRN WP Thyroid 32.5 MG Tablet 1 tablet on an empty  stomach Orally Once a day  Medication List reviewed and reconciled with the patient  .  PAST MEDICAL HISTORY  .  Systemic sclerosis  .  ALLERGIES  .  Keflex: Allergy  Cephalexin  gandolinium  .  SURGICAL HISTORY  .  Hand surgery-left elbow- rt hand calcium excision 06/23/2017  Radical resection, heterotopic bone and capsule, left elbow  07/24/2017  Sympathectomy radial and ulnar artery 10/28/2017  Left Hand Surgery 01/14/2018  .  FAMILY HISTORY  .  : unknown  Father: diagnosed with Diabetes mellitus without mention of  complication, type II or unspecified type, not stated as  uncontrolled  No autoimmune.  .  SOCIAL HISTORY  .  Marland Kitchen  Work/Occupation: full-time Consulting civil engineer.  .  Alcohol Yes but rarely.  .  Marital Status Single.  .  Tobaccohistory:Never smoked.  Marland Kitchen  HOSPITALIZATION/MAJOR DIAGNOSTIC PROCEDURE  .  Denies Past Hospitalization  .  REVIEW OF SYSTEMS  .  ORT:  .  Eyes    No . Ear, Nose Throat    No . Digestion, Stomach, Bowel     No . Bladder Problems    No . Bleeding Problems    No .  Numbness/Tingling    No . Anxiety/Depression     No .  Fever/Chills/Fatigue    No . Chest Pain/Tightness/Palpitations     No . Skin Rash    No . Dental Problems    No . Joint/Muscle  Pain/Cramps    No . Blackout/Fainting    No . Other    No .  .  VITAL SIGNS  .  Pain scale 0, Ht-in 5'5, Ht-cm 152.4.  .  PHYSICAL EXAMINATION  .  appears healthy. incisions well healed. PIP contractures bilat  small fingers. good cap refill.  .  ASSESSMENTS  .  Scleroderma - M34.9 (Primary)  .  Heterotopic ossification - M89.8X9  .  TREATMENT  .  Scleroderma  Notes: sutures removed. to be seen by OT for serial casting.  .  FOLLOW UP  .  4 Weeks  .  Electronically signed by [name] , MD on  04/04/2018 at 06:48 AM EST  .  Document electronically signed by [name]    .

## 2018-08-18 ENCOUNTER — Ambulatory Visit

## 2018-09-21 ENCOUNTER — Ambulatory Visit: Admitting: Hand Surgery

## 2018-09-21 ENCOUNTER — Ambulatory Visit: Admitting: Surgical

## 2018-09-21 ENCOUNTER — Ambulatory Visit

## 2018-09-21 NOTE — Progress Notes (Signed)
 * * *    [name], [name] **DOB:** 07-10-1985 (33 yo F) **Acc No.** [id number] **DOS:**  09/21/2018    ---       [name], [name]**    ------    89 Y old Female, DOB: November 11, 1985, External MRN: [id number]    Account Number: [id number]    [name], [address], [place], [place]    Home: [phone]    Insurance: HMO [employer]    PCP: Worthy Flank Referring: Worthy Flank    Appointment Facility: [facility]        * * *    09/21/2018  **Appointment Provider:** Herbert Deaner, [facility] **CHN#:** [phone]    ------     **Supervising Provider:** [name], MD    ---       **Reason for Appointment**    ---      1\. 1. Left index and middle finger calcinosis.    ---    2\. 2. Left small finger PIP joint contracture.    ---      **History of Present Illness**    ---     _GENERAL_ :    Follow-up. The patient is a 33 year old female who presents today for follow-  up. She is bothered the most by her left index and middle fingers currently.  She has noticed some calcinosis of these fingertips. She was also concerned  about the ulnar base of her thumb. She has a left small finger PIP joint  contracture related to attenuation of the extensor tendon. At her last visit,  it was recommended that she try serial casting; however, she has not yet been  able to do that. She also notes some calcinosis at her right ring fingertip.  She was able to remove a piece of calcium herself from this digit.      **Current Medications**    ---    Taking    * IVIg 800 mg infusion Oral 2x a month    ---    * Vitamin B Complex - Tablet Orally     ---    * Vitamin C Tablet Oral     ---    * Vitamin D 2000 U 1 tab every day    ---    Not-Taking/PRN    * Diazepam 5 mg Tablet 1 tablet as needed Orally PRN    ---    * Gabapentin 100 mg Capsule 1 capsule Orally Once a day PRN    ---    * Macrobid 100 MG Capsule 1 capsule with food Orally every 12 hrs    ---    * WP Thyroid 32.5 MG Tablet 1 tablet on an empty stomach  Orally Once a day    ---    * Medication List reviewed and reconciled with the patient    ---      **Past Medical History**    ---      Systemic sclerosis.        ---      **Surgical History**    ---      Hand surgery-left elbow- rt hand calcium excision 06/23/2017    ---    Radical resection, heterotopic bone and capsule, left elbow 07/24/2017    ---    Sympathectomy radial and ulnar artery 10/28/2017    ---    Left Hand Surgery 01/14/2018    ---      **Family History**    ---      :  unknown    ---    Father: diagnosed with Diabetes mellitus without mention of complication, type  II or unspecified type, not stated as uncontrolled    ---    No autoimmune.    ---      **Social History**    ---    Tobacco  history: Never smoked.    Marital Status  Single.    Work/Occupation: full-time Consulting civil engineer.    Alcohol  Yes but rarely.     **Allergies**    ---      Keflex: Allergy    ---    Cephalexin    ---    gandolinium    ---      **Hospitalization/Major Diagnostic Procedure**    ---      No Hospitalization History.    ---      **Review of Systems**    ---     _ORT_ :    Eyes No. Ear, Nose Throat No. Digestion, Stomach, Bowel No. Bladder Problems  No. Bleeding Problems No. Numbness/Tingling No. Anxiety/Depression No.  Fever/Chills/Fatigue No. Chest Pain/Tightness/Palpitations No. Skin Rash No.  Dental Problems No. Joint/Muscle Pain/Cramps Yes. Blackout/Fainting No. Other  No.         **Vital Signs**    ---    Pain scale 0, Ht-in 5'5, Ht-cm 152.4.      **Examination**    ---     _General Examination:_    On examination today, she is a well-appearing pleasant female in no apparent  distress. On examination of her left hand, there is palpable calcinosis at the  tip of the left index and middle fingers. No mass palpable at the base of the  thumb. She has a PIP joint contracture of her bilateral small fingers.    RADIOGRAPHS: Radiographs obtained today demonstrate calcinosis at the tips of  her left index and middle  fingers. There is no calcinosis evident at the  thumb. She has some small areas of recurrent calcinosis in her right hand as  well as calcinosis at the tip of her right ring finger.          **Assessments**    ---    1\. Systemic sclerosis - M34.9 (Primary)    ---    2\. Calcinosis - E83.59    ---      **Treatment**    ---      **1\. Others**    Notes: The patient was seen today with Dr. Rodman Pickle. With regards to her PIP  joint contractures of her small fingers, we recommend trying serial casting.  The other treatment option would be surgical and would likely be a PIP joint  fusion, which she would likely not be happy with given the fact that she  currently has such good flexion. She was given a prescription for serial  casting. With regards to the calcinosis of her left index and middle fingers,  she would like to proceed with surgical excision. Risks and benefits of  surgery were discussed with her today including infection, wound healing  problems, recurrence, incomplete symptom relief, and the postoperative  protocol. Surgery will be scheduled at her convenience.    ---     **Follow Up**    ---    In OR    **Appointment Provider:** Herbert Deaner, [facility]    Electronically signed by [name] , MD on 12/13/2018 at 08:31 PM EDT    Sign off status: Completed        * * *  [facility]    [address]    [place], [address]    [place], [place] [address]    Tel: [phone]    Fax: [phone]              * * *          Progress Note: Herbert Deaner, [facility] 09/21/2018    ---    Note generated by eClinicalWorks EMR/PM Software ([email or web address])

## 2018-09-21 NOTE — Progress Notes (Signed)
.  Progress Notes  .  Patient: [name]  Provider: Herbert Deaner    .  DOB: 16-Feb-1986 Age: 33 Y Sex: Female  Supervising Provider:: [name], MD  Date: 09/21/2018  .  PCP: Worthy Flank   Date: 09/21/2018  .  --------------------------------------------------------------------------------  .  REASON FOR APPOINTMENT  .  1. 1. Left index and middle finger calcinosis.  .  2. 2. Left small finger PIP joint contracture.  Marland Kitchen  HISTORY OF PRESENT ILLNESS  .  GENERAL:  Follow-up. The patient is a 33 year old  female who presents today for follow-up. She is bothered the most  by her left index and middle fingers currently. She has noticed  some calcinosis of these fingertips. She was also concerned about  the ulnar base of her thumb. She has a left small finger PIP  joint contracture related to attenuation of the extensor tendon.  At her last visit, it was recommended that she try serial  casting; however, she has not yet been able to do that. She also  notes some calcinosis at her right ring fingertip. She was able  to remove a piece of calcium herself from this digit.  Marland Kitchen  CURRENT MEDICATIONS  .  Taking IVIg 800 mg infusion Oral 2x a month  Taking Vitamin B Complex - Tablet Orally  Taking Vitamin C Tablet Oral  Taking Vitamin D 2000 U 1 tab every day  Not-Taking/PRN Diazepam 5 mg Tablet 1 tablet as needed Orally PRN  Not-Taking/PRN Gabapentin 100 mg Capsule 1 capsule Orally Once a  day PRN  Not-Taking/PRN Macrobid 100 MG Capsule 1 capsule with food Orally  every 12 hrs  Not-Taking/PRN WP Thyroid 32.5 MG Tablet 1 tablet on an empty  stomach Orally Once a day  Medication List reviewed and reconciled with the patient  .  PAST MEDICAL HISTORY  .  Systemic sclerosis  .  ALLERGIES  .  Keflex: Allergy  Cephalexin  gandolinium  .  SURGICAL HISTORY  .  Hand surgery-left elbow- rt hand calcium excision 06/23/2017  Radical resection, heterotopic bone and capsule, left elbow  07/24/2017  Sympathectomy radial and ulnar  artery 10/28/2017  Left Hand Surgery 01/14/2018  .  FAMILY HISTORY  .  : unknown  Father: diagnosed with Diabetes mellitus without mention of  complication, type II or unspecified type, not stated as  uncontrolled  No autoimmune.  .  SOCIAL HISTORY  .  .  Tobaccohistory:Never smoked.  .  Marital Status Single.  .  Work/Occupation: full-time Consulting civil engineer.  .  Alcohol Yes but rarely.  Marland Kitchen  HOSPITALIZATION/MAJOR DIAGNOSTIC PROCEDURE  .  No Hospitalization History.  Marland Kitchen  REVIEW OF SYSTEMS  .  ORT:  .  Eyes    No . Ear, Nose Throat    No . Digestion, Stomach, Bowel     No . Bladder Problems    No . Bleeding Problems    No .  Numbness/Tingling    No . Anxiety/Depression    No .  Fever/Chills/Fatigue    No . Chest Pain/Tightness/Palpitations     No . Skin Rash    No . Dental Problems    No . Joint/Muscle  Pain/Cramps    Yes . Blackout/Fainting    No . Other    No .  .  VITAL SIGNS  .  Pain scale 0, Ht-in 5'5, Ht-cm 152.4.  .  EXAMINATION  .  General Examination: On examination today, she is a  well-appearing pleasant female  in no apparent distress. On  examination of her left hand, there is palpable calcinosis at the  tip of the left index and middle fingers. No mass palpable at the  base of the thumb. She has a PIP joint contracture of her  bilateral small fingers.RADIOGRAPHS: Radiographs obtained today  demonstrate calcinosis at the tips of her left index and middle  fingers. There is no calcinosis evident at the thumb. She has  some small areas of recurrent calcinosis in her right hand as  well as calcinosis at the tip of her right ring finger.  .  ASSESSMENTS  .  Systemic sclerosis - M34.9 (Primary)  .  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  TREATMENT  .  Others  Notes: The patient was seen today with Dr. Rodman Pickle. With regards  to her PIP joint contractures of her small fingers, we recommend  trying serial casting. The other treatment option would be  surgical and would likely be a PIP joint fusion, which she would  likely not be happy with  given the fact that she currently has  such good flexion. She was given a prescription for serial  casting. With regards to the calcinosis of her left index and  middle fingers, she would like to proceed with surgical excision.  Risks and benefits of surgery were discussed with her today  including infection, wound healing problems, recurrence,  incomplete symptom relief, and the postoperative protocol.  Surgery will be scheduled at her convenience.  .  FOLLOW UP  .  In OR  .  Marland Kitchen  Appointment Provider: Herbert Deaner, [facility]  .  Electronically signed by [name] , MD on  12/13/2018 at 08:31 PM EDT  .  CONFIRMATORY SIGN OFF  .  Marland Kitchen  Document electronically signed by Herbert Deaner    .

## 2018-10-13 ENCOUNTER — Ambulatory Visit: Admitting: Surgery

## 2018-10-13 ENCOUNTER — Ambulatory Visit: Admitting: Hand Surgery

## 2018-10-13 NOTE — Progress Notes (Signed)
* * *      [name], [name] **DOB:** [birth year] (33 yo F) **Acc No.** [id number] **DOS:**  10/13/2018    ---       [name], [name]**    ------    28 Y old Female, DOB: 07-Nov-1985    [address] [name], [place], [place] [address]    Home: [phone]    Provider: [name]        * * *    Telephone Encounter    ---    Answered by  Hart Carwin Date: 10/13/2018       Time: 01:12 PM    Caller  Pt    ------            Reason  Call back request            Message                     Pt is schedule for surgery on Thursday she has to do the COvid testing and prefers to have the throat swab then through her nose , please contact patient see where can she go and get it done. Thanks                Action Taken                     Victorino,Heidi  10/13/2018 1:14:18 PM >      [name],[name]  10/13/2018 2:20:59 PM > , Action - Pt telephoned.  Spoke to pt.                    * * *                ---          * * *         Provider: [name] 10/13/2018    ---    Note generated by eClinicalWorks EMR/PM Software ([email or web address])

## 2018-10-15 ENCOUNTER — Ambulatory Visit: Admitting: Hand Surgery

## 2018-10-15 LAB — HX BF-URINALYSIS: HX URINE PREGNANCY TEST (HCG QUAL): NEGATIVE

## 2018-10-15 NOTE — Op Note (Signed)
 Patient    [name], [name]              Med Rec #:  [id number]  Name:  Operation  10/15/2018                Pt.  Dt:                                  Location:  .  Marland Kitchen                               OPERATIVE REPORT  .  Marland Kitchen  PREOPERATIVE DIAGNOSES:  1.  Calcinosis, left index finger.  2.  Calcinosis, left middle finger.  3.  Calcinosis, left ring finger.  Marland Kitchen  PROCEDURE:  1.  Excision of calcinosis, left index finger.  2.  Excision of calcinosis left middle finger.  3.  Excision of calcinosis, left ring finger.  .  SURGEON:  [name], MD  .  ASSISTANT:  Milinda Cave, MD  .  ANESTHESIA:  Sedation.  .  COMPLICATION:  None.  .  ESTIMATED BLOOD LOSS:  Minimal.  .  TOURNIQUET TIME:  Less than 2 hours.  .  INDICATION:  She has systemic sclerosis and painful calcinosis the pulps of  the left index, middle and ring fingers.  She also has pain over the dorsum  of the proximal phalanx level of the left thumb, but there is no evident  calcinosis radiographically.  She would like to have the calcinosis  removed.  Risks of the procedure including, but not limited to infection,  nerve damage, stiffness, recurrence, incomplete symptom relief, and tissue  loss were explained to the patient preoperatively who wished to proceed.  .  DESCRIPTION OF THE PROCEDURE:  Following adequate anesthesia, the patient  was placed in the supine position on the operating table.  The left upper  extremity was prepped and draped in sterile fashion.  The limb was  exsanguinated using an Esmarch and a proximal arm tourniquet elevated to  230 mmHg.  Webspace blocks were performed using 0.25% Marcaine.  .  A longitudinal incision was made over the tip of the pulp of the left index  finger.  The calcinosis was adherent to the undersurface of the dermis down  to the level of the bone.  The calcinosis was excised meticulously using a  curette and scalpel.  .  A longitudinal incision was made overlying the tip of the pulp of the left  middle finger.   Calcinosis was adherent to the undersurface of the dermis  as well as extending down to the bone.  The calcinosis was meticulously  excised using a curette and scalpel.  .  A longitudinal incision was made at the tip of the pulp of the left ring  finger.  The calcinosis was adherent to the undersurface of the dermis down  to the level of the bone at the tip of the tuft of the ring finger.  The  calcinosis was meticulously excised using a curette and scalpel.  .  The digits were imaged using mini C-arm fluoroscopy and there was no  residual calcinosis at the tips.  The tourniquet was released.  The fingers  pinked with good capillary refill.  The wounds were irrigated and  hemostasis was achieved with pressure followed by bipolar electrocautery.  The skin was approximated with  5-0 nylon interrupted sutures.  Sterile  loose dressings were applied to each digit.  The patient tolerated the  procedure well and was discharged to the recovery room in good condition.  .  Electronically Signed  [name], MD 10/21/2018 09:17 P  .  .  Marland Kitchen  Dictated byYetta Numbers, MD  .  D:    10/17/2018  T:    10/17/2018 08:43 A  Dictation ID:  [id number]/Doc#  [id number]  .  cc:  .  Marland Kitchen      Document is preliminary until electronically or manually signed by                             attending physician.

## 2018-10-29 ENCOUNTER — Ambulatory Visit: Admitting: Hand Surgery

## 2018-10-29 NOTE — Progress Notes (Signed)
* * *      [name], [name] **DOB:** Aug 21, 1985 (33 yo F) **Acc No.** [id number] **DOS:**  10/29/2018    ---       [name], [name]**    ------    40 Y old Female, DOB: 06-30-1985    [address], [address], [place], [place] [address]    Home: [phone]    Provider: [name]        * * *    Telephone Encounter    ---    Answered by  Driscilla Moats Date: 10/29/2018       Time: 12:17 PM    Caller  patient    ------            Reason  reschedule her post op appt            Message                     good afternoon,            patient is requesting to change her post op appointment from tomorrow to next week. She could be reached at [phone].            thank you                Action Taken                     Almendras,Marilyn  10/29/2018 12:19:31 PM >      I called the patient back and she is rescheduled for 11/02/18      [name],[name]  10/29/2018 3:29:04 PM >                     * * *                ---          * * *         Provider: [name] 10/29/2018    ---    Note generated by eClinicalWorks EMR/PM Software ([email or web address])

## 2018-11-02 ENCOUNTER — Ambulatory Visit: Admitting: Hand Surgery

## 2018-11-02 ENCOUNTER — Ambulatory Visit: Admitting: Surgical

## 2018-11-02 NOTE — Progress Notes (Signed)
 * * *    [name], [name] **DOB:** Jan 16, 1986 (33 yo F) **Acc No.** [id number] **DOS:**  11/02/2018    ---       [name], [name]**    ------    33 Y old Female, DOB: 01-28-1986, External MRN: [id number]    Account Number: [id number]    [name], [address], [place], [place]    Home: [phone]    Insurance: HMO BLUE OUT [employer]    PCP: Worthy Flank Referring: Worthy Flank    Appointment Facility: [facility]        * * *    11/02/2018  **Appointment Provider:** Herbert Deaner, [facility] **CHN#:** [id number]    ------     **Supervising Provider:** [name], MD    ---       **Reason for Appointment**    ---      1\. Status post excision of calcinosis, left index, middle, and ring  fingers, 10/15/18.    ---      **History of Present Illness**    ---     _GENERAL_ :    Follow-up. The patient is a 33 year old female who is now approximately 2  weeks status post the above procedure. She has systemic sclerosis and  developed painful calcinosis at the pulps of her fingers. She is feeling well  today. She does not complain of any pain. She has been using her hand  normally. She has kept it clean and dry.      **Current Medications**    ---    Taking    * IVIg 800 mg infusion Oral 2x a month    ---    * Vitamin B Complex - Tablet Orally     ---    * Vitamin C Tablet Oral     ---    * Vitamin D 2000 U 1 tab every day    ---    Not-Taking/PRN    * Diazepam 5 mg Tablet 1 tablet as needed Orally PRN    ---    * Gabapentin 100 mg Capsule 1 capsule Orally Once a day PRN    ---    * Macrobid 100 MG Capsule 1 capsule with food Orally every 12 hrs    ---    * WP Thyroid 32.5 MG Tablet 1 tablet on an empty stomach Orally Once a day    ---    * Medication List reviewed and reconciled with the patient    ---      **Past Medical History**    ---      Systemic sclerosis.        ---      **Surgical History**    ---      Hand surgery-left elbow- rt hand calcium excision 06/23/2017    ---    Radical  resection, heterotopic bone and capsule, left elbow 07/24/2017    ---    Sympathectomy radial and ulnar artery 10/28/2017    ---    Left Hand Surgery 01/14/2018    ---    Excision of calcinosis left index, left middle finger, and left ring finger  10/15/2018    ---      **Family History**    ---      : unknown    ---    Father: diagnosed with Diabetes mellitus without mention of complication, type  II or unspecified type, not stated as uncontrolled    ---    No  autoimmune.    ---      **Social History**    ---    Tobacco  history: Never smoked.    Marital Status  Single.    Work/Occupation: full-time Consulting civil engineer.    Alcohol  Yes but rarely.     **Allergies**    ---      Keflex: Allergy    ---    Cephalexin    ---    gandolinium    ---      **Hospitalization/Major Diagnostic Procedure**    ---      No Hospitalization History.    ---      **Review of Systems**    ---     _ORT_ :    Eyes No. Ear, Nose Throat No. Digestion, Stomach, Bowel No. Bladder Problems  No. Bleeding Problems No. Numbness/Tingling No. Anxiety/Depression No.  Fever/Chills/Fatigue No. Chest Pain/Tightness/Palpitations No. Skin Rash No.  Dental Problems No. Joint/Muscle Pain/Cramps Yes. Blackout/Fainting No. Other  No.         **Vital Signs**    ---    Pain scale 0, Ht-in 5'5, Ht-cm 152.4.      **Examination**    ---     _General Examination:_    On examination today, she is a well-appearing pleasant female in no apparent  distress. On examination of her left index, middle and ring fingers, surgical  incisions are healing well without erythema, drainage, or evidence of  infection. Sutures are in place. She has a left small finger PIP flexion  contracture with some skin irritation/callus evident on the dorsum. She has  well-maintained digital range of motion and the hand is warm and well  perfused.          **Assessments**    ---    1\. Calcinosis - E83.59 (Primary)    ---      **Treatment**    ---      **1\. Others**    Notes: The patient  was seen today with Dr. Rodman Pickle. Sutures were removed  today. She may continue using her hand normally. With regards to her left  small finger PIP joint contracture, she does have a cast; however, this is  causing some irritation at her dorsal PIP joint. We will have her seen by hand  therapy today for an LMB splint, which will not apply pressure directly over  the joint. She will follow up in 1 month for reevaluation and to discuss  possible further surgical intervention on her right ring finger and wrist.    ---     **Follow Up**    ---    4 Weeks    **Appointment Provider:** Herbert Deaner, [facility]    Electronically signed by [name] , MD on 12/13/2018 at 08:31 PM EDT    Sign off status: Completed        * * *        [facility]    [address]    [place], [address]    [place], [place] [address]    Tel: [phone]    Fax: [phone]              * * *          Progress Note: Herbert Deaner, [facility] 11/02/2018    ---    Note generated by eClinicalWorks EMR/PM Software ([email or web address])

## 2018-11-02 NOTE — Progress Notes (Signed)
 .  Progress Notes  .  Patient: [name]  Provider: Herbert Deaner    .  DOB: February 05, 1986 Age: 33 Y Sex: Female  Supervising Provider:: [name], MD  Date: 11/02/2018  .  PCP: Worthy Flank   Date: 11/02/2018  .  --------------------------------------------------------------------------------  .  REASON FOR APPOINTMENT  .  1. Status post excision of calcinosis, left index, middle, and  ring fingers, 10/15/18.  Marland Kitchen  HISTORY OF PRESENT ILLNESS  .  GENERAL:  Follow-up. The patient is a 33 year old  female who is now approximately 2 weeks status post the above  procedure. She has systemic sclerosis and developed painful  calcinosis at the pulps of her fingers. She is feeling well  today. She does not complain of any pain. She has been using her  hand normally. She has kept it clean and dry.  .  CURRENT MEDICATIONS  .  Taking IVIg 800 mg infusion Oral 2x a month  Taking Vitamin B Complex - Tablet Orally  Taking Vitamin C Tablet Oral  Taking Vitamin D 2000 U 1 tab every day  Not-Taking/PRN Diazepam 5 mg Tablet 1 tablet as needed Orally PRN  Not-Taking/PRN Gabapentin 100 mg Capsule 1 capsule Orally Once a  day PRN  Not-Taking/PRN Macrobid 100 MG Capsule 1 capsule with food Orally  every 12 hrs  Not-Taking/PRN WP Thyroid 32.5 MG Tablet 1 tablet on an empty  stomach Orally Once a day  Medication List reviewed and reconciled with the patient  .  PAST MEDICAL HISTORY  .  Systemic sclerosis  .  ALLERGIES  .  Keflex: Allergy  Cephalexin  gandolinium  .  SURGICAL HISTORY  .  Hand surgery-left elbow- rt hand calcium excision 06/23/2017  Radical resection, heterotopic bone and capsule, left elbow  07/24/2017  Sympathectomy radial and ulnar artery 10/28/2017  Left Hand Surgery 01/14/2018  Excision of calcinosis left index, left middle finger, and left  ring finger 10/15/2018  .  FAMILY HISTORY  .  : unknown  Father: diagnosed with Diabetes mellitus without mention of  complication, type II or unspecified type, not stated  as  uncontrolled  No autoimmune.  .  SOCIAL HISTORY  .  .  Tobaccohistory:Never smoked.  .  Marital Status Single.  .  Work/Occupation: full-time Consulting civil engineer.  .  Alcohol Yes but rarely.  Marland Kitchen  HOSPITALIZATION/MAJOR DIAGNOSTIC PROCEDURE  .  No Hospitalization History.  Marland Kitchen  REVIEW OF SYSTEMS  .  ORT:  .  Eyes    No . Ear, Nose Throat    No . Digestion, Stomach, Bowel     No . Bladder Problems    No . Bleeding Problems    No .  Numbness/Tingling    No . Anxiety/Depression    No .  Fever/Chills/Fatigue    No . Chest Pain/Tightness/Palpitations     No . Skin Rash    No . Dental Problems    No . Joint/Muscle  Pain/Cramps    Yes . Blackout/Fainting    No . Other    No .  .  VITAL SIGNS  .  Pain scale 0, Ht-in 5'5, Ht-cm 152.4.  .  EXAMINATION  .  General Examination: On examination today, she is a  well-appearing pleasant female in no apparent distress. On  examination of her left index, middle and ring fingers, surgical  incisions are healing well without erythema, drainage, or  evidence of infection. Sutures are in place. She has a left small  finger PIP  flexion contracture with some skin irritation/callus  evident on the dorsum. She has well-maintained digital range of  motion and the hand is warm and well perfused.  .  ASSESSMENTS  .  Calcinosis - E83.59 (Primary)  .  TREATMENT  .  Others  Notes: The patient was seen today with Dr. Rodman Pickle. Sutures were  removed today. She may continue using her hand normally. With  regards to her left small finger PIP joint contracture, she does  have a cast; however, this is causing some irritation at her  dorsal PIP joint. We will have her seen by hand therapy today for  an LMB splint, which will not apply pressure directly over the  joint. She will follow up in 1 month for reevaluation and to  discuss possible further surgical intervention on her right ring  finger and wrist.  .  FOLLOW UP  .  4 Weeks  .  Marland Kitchen  Appointment Provider: Herbert Deaner, [facility]  .  Electronically signed by [name] , MD on  12/13/2018 at 08:31 PM EDT  .  CONFIRMATORY SIGN OFF  .  Marland Kitchen  Document electronically signed by Herbert Deaner    .

## 2018-12-07 ENCOUNTER — Ambulatory Visit

## 2018-12-14 ENCOUNTER — Ambulatory Visit: Admitting: Hand Surgery

## 2019-01-26 ENCOUNTER — Ambulatory Visit: Admitting: Hand Surgery

## 2019-01-26 NOTE — Progress Notes (Signed)
* * *      [name], [name] **DOB:** 08/26/1985 (33 yo F) **Acc No.** [id number] **DOS:**  01/26/2019    ---       [name], [name]**    ------    60 Y old Female, DOB: 1985-10-12    [address], [address], [place], [place] [address]    Home: [phone]    Provider: [name]        * * *    Telephone Encounter    ---    Answered by  Wilburt Finlay Date: 01/26/2019       Time: 04:00 PM    Caller  [name], pt    ------            Reason  Post-op Medication            Message                     Good Afternoon,       Ms. Dolce has surgery scheduled for this Thursday, October 29th with Dr. Rodman Pickle and she is requesting to see if her post-op medications can be prescribed to her in advanced. She would like a script for low dose of Gabapentin Ms. Lefevre will not have assistance during post-op and is hoping she can pick up her medications prior. If so, please reach out to Ms. Leppla directly at, [phone]. Thank you!             [address], [address] [place], [place] [address]                      Action Taken                     Gilles,Guerbie  01/26/2019 4:03:50 PM >            Erasmo Score, I called patient twice.  One time she was on another call.  Second time, I left a voicemail that she could call the office to confirm which prescriptions, including dosages, she would like to fill and to confirm the pharmacy.      GORDON,LISA  01/27/2019 12:02:57 PM >      [name],[name]  01/27/2019 2:58:11 PM > Patient decided to get prescription tomorrow when she comes in for surgery.                    * * *                ---          * * *         Provider: [name] 01/26/2019    ---    Note generated by eClinicalWorks EMR/PM Software ([email or web address])

## 2019-01-28 ENCOUNTER — Ambulatory Visit: Admitting: Hand Surgery

## 2019-01-28 LAB — HX BF-URINALYSIS: HX URINE PREGNANCY TEST (HCG QUAL): NEGATIVE

## 2019-01-28 NOTE — Op Note (Signed)
 Patient    [name], [name]              Med Rec #:  [id number]  Name:  Operation  01/28/2019                Pt.  Dt:                                  Location:  .  Marland Kitchen                               OPERATIVE REPORT  .  Marland Kitchen  PREOPERATIVE DIAGNOSES:  1.  Calcinosis right wrist.  2.  Calcinosis right index finger.  3.  Calcinosis right middle finger.  4.  Calcinosis right ring finger.  Marland Kitchen  POSTOPERATIVE DIAGNOSES:  1.  Calcinosis right wrist.  2.  Calcinosis right index finger.  3.  Calcinosis right middle finger.  4.  Calcinosis right ring finger.  Marland Kitchen  PROCEDURES:  1.  Excision calcinosis right wrist.  2.  Excision calcinosis right index finger.  3.  Excision calcinosis right middle finger.  4.  Excision calcinosis right ring finger.  .  ATTENDING SURGEON:  [name], M.D.  .  ASSISTANT:  Randel Pigg, M.D.  .  ANESTHESIA:  Local with sedation.  .  COMPLICATIONS:  None.  .  ESTIMATED BLOOD LOSS:  Minimal.  .  TOURNIQUET TIME:  Less than 2 hours.  .  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  She would like to proceed with excision of  the lesions as she has had successful response to surgery for this problem  in the past.  Risks of surgery including, but not limited to infection,  nerve damage, stiffness, recurrence, tendon loss, incomplete symptom  relief, nail deformity were explained to the patient preoperatively who  wished to proceed.  .  DESCRIPTION OF PROCEDURE:  Following adequate anesthesia, the patient was  placed in the supine position on the operating table.  The right upper  extremity was prepped and draped in sterile fashion.  A radial sensory and  webspace block were performed using 2% mepivacaine.  A dorsal sensory ulnar  nerve block was also performed using 2% mepivacaine.  A transverse incision  was made over the dorsum of the left wrist over the triquetral area.  Careful dissection was carried through the subcutaneous tissue.   Hemostasis  was achieved with bipolar electrocautery.  The calcinosis was semisolid and  was excised with a curette and a rongeur.  Fluoroscopy demonstrated that  there was additional calcinosis deep to this area over the triquetrum and  dissection was carried deep through the retinaculum over the triquetrum and  the calcinosis was excised.  Fluoroscopy confirmed that the calcinosis had  been removed.  .  Next, attention was directed to the right index finger.  An incision was  made at the ulnar aspect of the right index finger at the PIP joint.  Cutaneous nerve branches were protected.  The transverse retinacular  ligament was incised and the ulnar lateral band was retracted dorsally.  The calcinosis was present involving the ulnar collateral ligament of the  PIP joint.  The calcinosis was excised with a curette and rongeur,  preserving the integrity of the collateral ligament.  .  Next, a radial midaxial  incision was made at the PIP joint of the right  middle finger.  The transverse retinacular ligament was incised and the  radial lateral band was elevated dorsally.  The calcinosis was present and  involving the accessory collateral ligament and retrocondylar recess and  volar plate of the PIP joint.  The calcinosis was excised using a  combination of a scalpel, a curette and rongeur with care to protect the  true collateral ligament.  There was some scalloping noted fluoroscopically  from the calcinosis and there were no residual deposits.  .  Next, attention was directed to the right index finger.  A longitudinal  incision was made over the distal pulp of the right index finger.  Sharp  dissection was carried through the subcutaneous tissue down to the level of  the calcinosis, which was excised using a curette and rongeur.  .  Next, a longitudinal incision was made over the distal pulp of the right  ring finger.  Sharp dissection was carried down to the level of the bone.  The calcinosis involved the  hyponychium at the level of the hook nail  remnant.  The calcinosis was excised using a curette and rongeur.  The  tourniquet was then released.  The fingers pinked with good capillary  refill.  The wounds were irrigated copiously.  The skin was approximated  with 5-0 nylon interrupted sutures.  Sterile dressings were applied.  The  patient tolerated the procedure well and was discharged to the recovery  room in good condition.  .  Electronically Signed  [name], MD 02/08/2019 04:18 A  .  .  Marland Kitchen  Dictated byYetta Numbers, MD  .  D:    01/31/2019  T:    01/31/2019 09:09 P  Dictation ID:  [id number]/Doc#  [id number]  .  cc:  .  Marland Kitchen      Document is preliminary until electronically or manually signed by                             attending physician.

## 2019-02-02 ENCOUNTER — Ambulatory Visit: Admitting: Hand Surgery

## 2019-02-02 NOTE — Progress Notes (Signed)
* * *      [name], [name] **DOB:** 01-29-1986 (33 yo F) **Acc No.** [id number] **DOS:**  02/02/2019    ---       [name], Misk**    ------    38 Y old Female, DOB: [birth year]    [address], [address], [place], [place] [address]    Home: [phone]    Provider: [name]        * * *    Telephone Encounter    ---    Answered by  Hart Carwin Date: 02/02/2019       Time: 12:38 PM    Caller  Pt    ------            Reason  Call back request            Message                     Patient is requesting a call back in regards to operative notes and question of what medication she was gving in the operative room? please contact patient. Thank you.                 Action Taken                     Victorino,Heidi  02/02/2019 12:39:29 PM >      [name],[name]  02/03/2019 10:12:59 AM > , Action - Pt telephoned.  Spoke to pt.                    * * *                ---          * * *         Provider: [name] 02/02/2019    ---    Note generated by eClinicalWorks EMR/PM Software ([email or web address])

## 2019-02-05 ENCOUNTER — Ambulatory Visit: Admitting: Hand Surgery

## 2019-02-05 NOTE — Progress Notes (Signed)
* * *      [name], [name] **DOB:** 03/19/1986 (33 yo F) **Acc No.** [id number] **DOS:**  02/05/2019    ---       [name], [name]**    ------    73 Y old Female, DOB: 1985-04-10    [address], [address], [place], [place] [address]    Home: [phone]    Provider: [name]        * * *    Telephone Encounter    ---    Answered by  Loel Lofty Date: 02/05/2019       Time: 10:12 AM    Caller  patient    ------            Reason  reschedule post op            Message                     Patient is calling to reschedule her post op appt for Tues 11/10 to remove her stitches, please contact her at [phone], thank you.                Action Taken                     Benoit,Kyara  02/05/2019 10:14:12 AM >      [name],[name]  02/08/2019 10:28:43 AM > Rescheduled for 02/12/19                    * * *                ---          * * *         Provider: [name] 02/05/2019    ---    Note generated by eClinicalWorks EMR/PM Software ([email or web address])

## 2019-02-12 ENCOUNTER — Ambulatory Visit: Admitting: Hand Surgery

## 2019-02-12 ENCOUNTER — Ambulatory Visit: Admitting: Surgical

## 2019-02-12 NOTE — Progress Notes (Signed)
 .  Progress Notes  .  Patient: [name]  Provider: Herbert Deaner    .  DOB: 09-Apr-1985 Age: 33 Y Sex: Female  Supervising Provider:: [name], MD  Date: 02/12/2019  .  PCP: Worthy Flank   Date: 02/12/2019  .  --------------------------------------------------------------------------------  .  REASON FOR APPOINTMENT  .  1. Status post excision calcinosis, right wrist, right index  finger, middle finger and ring finger, 01/28/19.  Marland Kitchen  HISTORY OF PRESENT ILLNESS  .  GENERAL:  Follow-up. The patient is a 33 year old  female with scleroderma and calcinosis in her digits and wrist.  She has now approximately 2 weeks status post the above  procedure. Overall, she is doing well. She does note that  postoperatively she had a reaction to some of the anesthesia  medications and developed hemorrhagic cystitis. She has been in  touch with the anesthesiologist, so that she may review the list  of medications with her primary care provider.  .  CURRENT MEDICATIONS  .  Taking IVIg 800 mg infusion Oral 2x a month  Taking Vitamin B Complex - Tablet Orally  Taking Vitamin C Tablet Oral  Taking Vitamin D 2000 U 1 tab every day  Taking [facility] Thyroid 32.5 MG Tablet 1 tablet on an empty stomach  Orally Once a day  Not-Taking/PRN Diazepam 5 mg Tablet 1 tablet as needed Orally PRN  Not-Taking/PRN Gabapentin 100 mg Capsule 1 capsule Orally Once a  day PRN  Not-Taking/PRN Macrobid 100 MG Capsule 1 capsule with food Orally  every 12 hrs  Medication List reviewed and reconciled with the patient  .  PAST MEDICAL HISTORY  .  Systemic sclerosis  .  ALLERGIES  .  Keflex: Allergy  Cephalexin  gandolinium  .  SURGICAL HISTORY  .  Hand surgery-left elbow- rt hand calcium excision 06/23/2017  Radical resection, heterotopic bone and capsule, left elbow  07/24/2017  Sympathectomy radial and ulnar artery 10/28/2017  Left Hand Surgery 01/14/2018  Excision of calcinosis left index, left middle finger, and left  ring finger 10/15/2018  Excision  calcinosis right wrist, right index finger, right middle  finger, and right ring finger 01/28/2019  .  FAMILY HISTORY  .  : unknown  Father: diagnosed with Diabetes mellitus without mention of  complication, type II or unspecified type, not stated as  uncontrolled  No autoimmune.  .  SOCIAL HISTORY  .  .  Tobaccohistory:Never smoked.  .  Marital Status Single.  .  Work/Occupation: full-time Consulting civil engineer.  .  Alcohol Yes but rarely.  Marland Kitchen  HOSPITALIZATION/MAJOR DIAGNOSTIC PROCEDURE  .  No Hospitalization History.  Marland Kitchen  REVIEW OF SYSTEMS  .  ORT:  .  Eyes    No . Ear, Nose Throat    No . Digestion, Stomach, Bowel     No . Bladder Problems    No . Bleeding Problems    No .  Numbness/Tingling    No . Anxiety/Depression    No .  Fever/Chills/Fatigue    No . Chest Pain/Tightness/Palpitations     No . Skin Rash    No . Dental Problems    No . Joint/Muscle  Pain/Cramps    Yes . Blackout/Fainting    No . Other    No .  .  VITAL SIGNS  .  Pain scale 0, Ht-in 5'5, Ht-cm 152.4.  .  EXAMINATION  .  General Examination: On examination today, she is a  well-appearing female in no apparent distress. On  examination of  her right upper extremity, surgical incisions are all healing  well without erythema, drainage or evidence of infection. Sutures  are in place. She has limited digital range of motion at  baseline. The hand is well perfused.  .  ASSESSMENTS  .  Calcinosis - E83.59 (Primary)  .  TREATMENT  .  Others  Notes: The patient was seen today with Dr. Rodman Pickle. She is now 2  weeks status post the above procedure. Her sutures will be  removed today. She was provided with a prescription for hand  therapy so that she may work on range of motion as well as have  her nighttime splints adjusted. She will follow up in 6 weeks for  reevaluation.  .  FOLLOW UP  .  6 Weeks  .  Marland Kitchen  Appointment Provider: Herbert Deaner, [facility]  .  Electronically signed by [name] , MD on  02/17/2019 at 09:07 AM EST  .  CONFIRMATORY SIGN OFF  .  Marland Kitchen  Document  electronically signed by Herbert Deaner    .

## 2019-02-12 NOTE — Progress Notes (Signed)
 * * *    [name], [name] **DOB:** Oct 14, 1985 (33 yo F) **Acc No.** [id number] **DOS:**  02/12/2019    ---       [name], [name]**    ------    36 Y old Female, DOB: 12-30-1985, External MRN: [id number]    Account Number: [id number]    [name], [address], [place], [place]    Home: [phone]    Insurance: HMO BLUE OUT [employer]    PCP: Worthy Flank Referring: Worthy Flank    Appointment Facility: [facility]        * * *    02/12/2019  **Appointment Provider:** Herbert Deaner, [facility] **CHN#:** [id number]    ------     **Supervising Provider:** [name], MD    ---       **Reason for Appointment**    ---      1\. Status post excision calcinosis, right wrist, right index finger,  middle finger and ring finger, 01/28/19.    ---      **History of Present Illness**    ---     _GENERAL_ :    Follow-up. The patient is a 33 year old female with scleroderma and calcinosis  in her digits and wrist. She has now approximately 2 weeks status post the  above procedure. Overall, she is doing well. She does note that  postoperatively she had a reaction to some of the anesthesia medications and  developed hemorrhagic cystitis. She has been in touch with the  anesthesiologist, so that she may review the list of medications with her  primary care provider.      **Current Medications**    ---    Taking    * IVIg 800 mg infusion Oral 2x a month    ---    * Vitamin B Complex - Tablet Orally     ---    * Vitamin C Tablet Oral     ---    * Vitamin D 2000 U 1 tab every day    ---    * WP Thyroid 32.5 MG Tablet 1 tablet on an empty stomach Orally Once a day    ---    Not-Taking/PRN    * Diazepam 5 mg Tablet 1 tablet as needed Orally PRN    ---    * Gabapentin 100 mg Capsule 1 capsule Orally Once a day PRN    ---    * Macrobid 100 MG Capsule 1 capsule with food Orally every 12 hrs    ---    * Medication List reviewed and reconciled with the patient    ---      **Past Medical History**    ---       Systemic sclerosis.        ---      **Surgical History**    ---      Hand surgery-left elbow- rt hand calcium excision 06/23/2017    ---    Radical resection, heterotopic bone and capsule, left elbow 07/24/2017    ---    Sympathectomy radial and ulnar artery 10/28/2017    ---    Left Hand Surgery 01/14/2018    ---    Excision of calcinosis left index, left middle finger, and left ring finger  10/15/2018    ---    Excision calcinosis right wrist, right index finger, right middle finger, and  right ring finger 01/28/2019    ---      **Family History**    ---      :  unknown    ---    Father: diagnosed with Diabetes mellitus without mention of complication, type  II or unspecified type, not stated as uncontrolled    ---    No autoimmune.    ---      **Social History**    ---    Tobacco  history: Never smoked.    Marital Status  Single.    Work/Occupation: full-time Consulting civil engineer.    Alcohol  Yes but rarely.     **Allergies**    ---      Keflex: Allergy    ---    Cephalexin    ---    gandolinium    ---      **Hospitalization/Major Diagnostic Procedure**    ---      No Hospitalization History.    ---      **Review of Systems**    ---     _ORT_ :    Eyes No. Ear, Nose Throat No. Digestion, Stomach, Bowel No. Bladder Problems  No. Bleeding Problems No. Numbness/Tingling No. Anxiety/Depression No.  Fever/Chills/Fatigue No. Chest Pain/Tightness/Palpitations No. Skin Rash No.  Dental Problems No. Joint/Muscle Pain/Cramps Yes. Blackout/Fainting No. Other  No.         **Vital Signs**    ---    Pain scale 0, Ht-in 5'5, Ht-cm 152.4.      **Examination**    ---     _General Examination:_    On examination today, she is a well-appearing female in no apparent distress.  On examination of her right upper extremity, surgical incisions are all  healing well without erythema, drainage or evidence of infection. Sutures are  in place. She has limited digital range of motion at baseline. The hand is  well perfused.           **Assessments**    ---    1\. Calcinosis - E83.59 (Primary)    ---      **Treatment**    ---      **1\. Others**    Notes: The patient was seen today with Dr. Rodman Pickle. She is now 2 weeks status  post the above procedure. Her sutures will be removed today. She was provided  with a prescription for hand therapy so that she may work on range of motion  as well as have her nighttime splints adjusted. She will follow up in 6 weeks  for reevaluation.    ---     **Follow Up**    ---    6 Weeks    **Appointment Provider:** Herbert Deaner, [facility]    Electronically signed by [name] , MD on 02/17/2019 at 09:07 AM EST    Sign off status: Completed        * * *        [facility]    [address]    [place], [address]    [place], [place] [address]    Tel: [phone]    Fax: [phone]              * * *          Progress Note: Herbert Deaner, [facility] 02/12/2019    ---    Note generated by eClinicalWorks EMR/PM Software ([email or web address])

## 2019-02-14 ENCOUNTER — Ambulatory Visit: Admitting: Orthopaedic Surgery

## 2019-02-14 NOTE — Progress Notes (Signed)
* * *      [name], [name] **DOB:** [birth year] (34 yo F) **Acc No.** [id number] **DOS:**  02/14/2019    ---       [name], [name]**    ------    61 Y old Female, DOB: Apr 08, 1985    [address], [address], [place], [place] [address]    Home: [phone]    Provider: Chanetta Marshall        * * *    Telephone Encounter    ---    Answered by  Chanetta Marshall Date: 02/14/2019       Time: 06:09 PM    Caller  patient    ------            Reason  *Clinical Advice After Hours re:            Message                     patient is a 33yo F with a hx of scleroderma and calcinosis who is s/p excision calcinosis right wrist, IF, MF, and RF 01/28/19. Patient had sutures removed 11/13 in clinic. Calling due to erythema, swelling and pain since then.                 Action Taken                     Patient states during suture removal of one finger, she had irritation and it was painful to get the stitches removed. Since then, she has had swelling, pain and erythema around the incision. No drainage. No fever or chills. Patient states it is painful to move the finger. She has only taken tylenol. ~1 week ago, she was admitted for a UTI and kidney infection, and is currently taking cefodoxime for this infection at home. She otherwise feels well. Explained to patient that the increase is pain may just be related to irritation from suture removal; however, a developing infection is possible. I recommended NSAIDs over the counter to help with pain control. Also instructed Dennise to call the office 11/16 AM to make an appointment to be seen for an evaluation if symptoms are not improved. Patient is coming in for an OT appointment tomorrow anyways, and agrees with this plan. Instructed her to come to the ED if she developed any worsening erythema, drainage, worsening pain, or fevers/chills. All questions were answered.                     * * *                ---          * * *         Provider: Chanetta Marshall 02/14/2019    ---     Note generated by eClinicalWorks EMR/PM Software ([email or web address])

## 2019-02-15 ENCOUNTER — Ambulatory Visit: Admitting: Physician Assistant

## 2019-02-15 ENCOUNTER — Ambulatory Visit: Admitting: Hand Surgery

## 2019-02-15 ENCOUNTER — Ambulatory Visit

## 2019-02-15 NOTE — Progress Notes (Signed)
 .  Progress Notes  .  Patient: [name]  Provider: Kathrine Cords  DOB: 1985/09/13 Age: 33 Y Sex: Female  Supervising Provider:: [name], MD  Date: 02/15/2019  .  PCP: Worthy Flank   Date: 02/15/2019  .  --------------------------------------------------------------------------------  .  REASON FOR APPOINTMENT  .  1. concern for infection s/p excision calcinosis right wrist, IF,  MF, RF DOS 01/28/19  .  HISTORY OF PRESENT ILLNESS  .  COVID SCREEN:   Patient is a 33 year old female presenting to clinic due to  concern of infection s/p the above mentioned procedure. Denies  any fevers or chills. No drainage from the site. Reports  tenderness over the PIP joint of the right index finger. No  issues with other incisions. She is on Cefpoxidime after being  hospitalized for pyelonephritis last week at [facility]. She has 4 days  left of that medicine.  .  CURRENT MEDICATIONS  .  Taking Ceftriaxone Sodium 1 tab Oral , Notes: for 8 days  Taking IVIg 800 mg infusion Oral 2x a month  Taking Miconazole 50 MG Tablet 1 tablet [place]ally Once a day  Taking Vitamin B Complex - Tablet Orally  Taking Vitamin C Tablet Oral  Taking Vitamin D 2000 U 1 tab every day  Taking [facility] Thyroid 32.5 MG Tablet 1 tablet on an empty stomach  Orally Once a day  Not-Taking/PRN Diazepam 5 mg Tablet 1 tablet as needed Orally PRN  Not-Taking/PRN Gabapentin 100 mg Capsule 1 capsule Orally Once a  day PRN  Not-Taking/PRN Macrobid 100 MG Capsule 1 capsule with food Orally  every 12 hrs  Medication List reviewed and reconciled with the patient  .  PAST MEDICAL HISTORY  .  Systemic sclerosis  .  ALLERGIES  .  Keflex: Allergy  Cephalexin  gandolinium  .  SURGICAL HISTORY  .  Hand surgery-left elbow- rt hand calcium excision 06/23/2017  Radical resection, heterotopic bone and capsule, left elbow  07/24/2017  Sympathectomy radial and ulnar artery 10/28/2017  Left Hand Surgery 01/14/2018  Excision of calcinosis left index, left middle finger, and  left  ring finger 10/15/2018  Excision calcinosis right wrist, right index finger, right middle  finger, and right ring finger 01/28/2019  .  FAMILY HISTORY  .  : unknown  Father: diagnosed with Diabetes mellitus without mention of  complication, type II or unspecified type, not stated as  uncontrolled  No autoimmune.  .  SOCIAL HISTORY  .  .  Tobacco  history:Never smoked  .  Marland Kitchen  Marital Status  Single  .  Marland Kitchen  Work/Occupation: full-time Consulting civil engineer.  .  .  Alcohol  Yes but rarely  .  HOSPITALIZATION/MAJOR DIAGNOSTIC PROCEDURE  .  No Hospitalization History.  Marland Kitchen  REVIEW OF SYSTEMS  .  ORT:  .  Eyes    No . Ear, Nose Throat    No . Digestion, Stomach, Bowel     No . Bladder Problems    No . Bleeding Problems    No .  Numbness/Tingling    No . Anxiety/Depression    No .  Fever/Chills/Fatigue    No . Chest Pain/Tightness/Palpitations     No . Skin Rash    No . Dental Problems    No . Joint/Muscle  Pain/Cramps    Yes . Blackout/Fainting    No . Other    No .  .  VITAL SIGNS  .  Pain scale 5,  Ht-in 5'5, Ht-cm 152.4.  .  EXAMINATION  .  General Examination: On examination today, she is a  well-appearing female in no apparent distress. On examination of  her right upper extremity, surgical incisions are all healing  well without drainage. The proximal phalanx of the right index  finger is moderately swollen and tender to touch. She has pain  with flexion of the PIP joint. Steri strips are in place. She has  limited digital range of motion at baseline. The hand is well  perfused.  .  ASSESSMENTS  .  Cutaneous abscess of unspecified hand - L02.519 (Primary)  .  Cellulitis of unspecified finger - L03.019  .  TREATMENT  .  Cutaneous abscess of unspecified hand  Start Bactrim DS Tablet, 800-160 MG, 1 tablet, Orally, Twice a  day, 5 day(s), 10 Tablet, Refills 0  Start Fluconazole Tablet, 150 MG, 1 tablet, Orally, 1 tablet a  day, 1 day, 1, Refills 0  Notes: Patient seen and examined with Dr. Rodman Pickle. She is almost  3 weeks out from  above mentioned procedure. She noticed 2-3 days  ago some worsening swelling of the right index finger.  Clinically, the finger is swollen and tender to touch. There is  concern for cellulitis. She is already on Cephalosporin and is  allergic to Keflex. We would like to cover gram + bacteria. She  does not have a known allergy to Bactrim. We have sent a 5 day  prescription to the pharmacy. She is also requesting treatment  for yeast infection as she is prone after abx. She will call or  return to clinic for any new or worsening symptoms. All of her  questions and concerns were addressed.  .  FOLLOW UP  .  6 Weeks  .  Marland Kitchen  Appointment Provider: Kathrine Cords, PA-C  .  Electronically signed by [name] , MD on  02/17/2019 at 09:07 AM EST  .  CONFIRMATORY SIGN OFF  .  Marland Kitchen  Document electronically signed by Kathrine Cords    .

## 2019-02-15 NOTE — Progress Notes (Signed)
 * * *    [name], [name] **DOB:** [birth year] (33 yo F) **Acc No.** [id number] **DOS:**  02/15/2019    ---       [name], [name]**    ------    [name], DOB: 1986-02-13, External MRN: [id number]    Account Number: [id number]    [name], [address], [place], [place]    Home: [phone]    Insurance: HMO BLUE OUT [employer]    PCP: Worthy Flank Referring: Worthy Flank    Appointment Facility: [facility]        * * *    02/15/2019  **Appointment Provider:** Kathrine Cords, PA-C **CHN#:** [id number]    ------     **Supervising Provider:** [name], MD    ---       **Reason for Appointment**    ---      1\. concern for infection s/p excision calcinosis right wrist, IF, MF, RF  DOS 01/28/19    ---      **History of Present Illness**    ---     _COVID SCREEN_ :    Patient is a 33 year old female presenting to clinic due to concern of  infection s/p the above mentioned procedure. Denies any fevers or chills. No  drainage from the site. Reports tenderness over the PIP joint of the right  index finger. No issues with other incisions. She is on Cefpoxidime after  being hospitalized for pyelonephritis last week at [facility]. She has 4 days left of  that medicine.      **Current Medications**    ---    Taking    * Ceftriaxone Sodium 1 tab Oral , Notes: for 8 days    ---    * IVIg 800 mg infusion Oral 2x a month    ---    * Miconazole 50 MG Tablet 1 tablet Bucally Once a day    ---    * Vitamin B Complex - Tablet Orally     ---    * Vitamin C Tablet Oral     ---    * Vitamin D 2000 U 1 tab every day    ---    * WP Thyroid 32.5 MG Tablet 1 tablet on an empty stomach Orally Once a day    ---    Not-Taking/PRN    * Diazepam 5 mg Tablet 1 tablet as needed Orally PRN    ---    * Gabapentin 100 mg Capsule 1 capsule Orally Once a day PRN    ---    * Macrobid 100 MG Capsule 1 capsule with food Orally every 12 hrs    ---    * Medication List reviewed and reconciled with the patient    ---       **Past Medical History**    ---      Systemic sclerosis.        ---      **Surgical History**    ---      Hand surgery-left elbow- rt hand calcium excision 06/23/2017    ---    Radical resection, heterotopic bone and capsule, left elbow 07/24/2017    ---    Sympathectomy radial and ulnar artery 10/28/2017    ---    Left Hand Surgery 01/14/2018    ---    Excision of calcinosis left index, left middle finger, and left ring finger  10/15/2018    ---    Excision  calcinosis right wrist, right index finger, right middle finger, and  right ring finger 01/28/2019    ---      **Family History**    ---      : unknown    ---    Father: diagnosed with Diabetes mellitus without mention of complication, type  II or unspecified type, not stated as uncontrolled    ---    No autoimmune.    ---      **Social History**    ---    Tobacco    history: _Never smoked_    Marital Status    _Single_    Work/Occupation: full-time Consulting civil engineer.    Alcohol    _Yes but rarely_      **Allergies**    ---      Keflex: Allergy    ---    Cephalexin    ---    gandolinium    ---      **Hospitalization/Major Diagnostic Procedure**    ---      No Hospitalization History.    ---      **Review of Systems**    ---     _ORT_ :    Eyes No. Ear, Nose Throat No. Digestion, Stomach, Bowel No. Bladder Problems  No. Bleeding Problems No. Numbness/Tingling No. Anxiety/Depression No.  Fever/Chills/Fatigue No. Chest Pain/Tightness/Palpitations No. Skin Rash No.  Dental Problems No. Joint/Muscle Pain/Cramps Yes. Blackout/Fainting No. Other  No.         **Vital Signs**    ---    Pain scale 5, Ht-in 5'5, Ht-cm 152.4.      **Examination**    ---     _General Examination:_    On examination today, she is a well-appearing female in no apparent distress.  On examination of her right upper extremity, surgical incisions are all  healing well without drainage. The proximal phalanx of the right index finger  is moderately swollen and tender to touch. She has pain with  flexion of the  PIP joint. Steri strips are in place. She has limited digital range of motion  at baseline. The hand is well perfused.          **Assessments**    ---    1\. Cutaneous abscess of unspecified hand - L02.519 (Primary)    ---    2\. Cellulitis of unspecified finger - L03.019    ---      **Treatment**    ---      **1\. Cutaneous abscess of unspecified hand**    Start Bactrim DS Tablet, 800-160 MG, 1 tablet, Orally, Twice a day, 5 day(s),  10 Tablet, Refills 0    Start Fluconazole Tablet, 150 MG, 1 tablet, Orally, 1 tablet a day, 1 day, 1,  Refills 0    Notes: Patient seen and examined with Dr. Rodman Pickle. She is almost 3 weeks out  from above mentioned procedure. She noticed 2-3 days ago some worsening  swelling of the right index finger. Clinically, the finger is swollen and  tender to touch. There is concern for cellulitis. She is already on  Cephalosporin and is allergic to Keflex. We would like to cover gram +  bacteria. She does not have a known allergy to Bactrim. We have sent a 5 day  prescription to the pharmacy. She is also requesting treatment for yeast  infection as she is prone after abx. She will call or return to clinic for any  new or worsening symptoms. All of her questions and concerns were  addressed.    ---     **Follow Up**    ---    6 Weeks    **Appointment Provider:** Kathrine Cords, PA-C    Electronically signed by [name] , MD on 02/17/2019 at 09:07 AM EST    Sign off status: Completed        * * *        [facility]    [address]    [place], [address]    [place], [place] [address]    Tel: [phone]    Fax: [phone]              * * *          Progress Note: Kathrine Cords, PA-C 02/15/2019    ---    Note generated by eClinicalWorks EMR/PM Software ([email or web address])

## 2019-02-15 NOTE — Progress Notes (Signed)
* * *      [name], [name] **DOB:** [birth year] (33 yo F) **Acc No.** [id number] **DOS:**  02/15/2019    ---       [name], [name]**    ------    85 Y old Female, DOB: 1985-06-13    [address], [address], [place], [place] [address]    Home: [phone]    Provider: [name]        * * *    Telephone Encounter    ---    Answered by  Gypsy Lore Date: 02/15/2019       Time: 09:21 AM    Caller  Patient    ------            Reason  Infected Incision            Message                     Good morning,       This patient is calling because she has an infected incision and she would like to see Dr. Rodman Pickle today to take care of it. She can be reachedat [phone]. Thanks                Action Taken                     Ortiz,Madeline  02/15/2019 9:23:17 AM >      [name],[name]  02/15/2019 9:27:57 AM > Left her a voice mail message and sent her an email. She can come in today to be seen.                    * * *                ---          * * *         Provider: [name] 02/15/2019    ---    Note generated by eClinicalWorks EMR/PM Software ([email or web address])

## 2019-02-17 ENCOUNTER — Ambulatory Visit: Admitting: Hand Surgery

## 2019-02-17 NOTE — Progress Notes (Signed)
 * * *    [name], [name] **DOB:** 1985-09-05 (33 yo F) **Acc No.** [id number] **DOS:**  02/17/2019    ---       [name], [name]**    ------    33 Y old Female, DOB: [birth year], External MRN: [id number]    Account Number: [id number]    [name], [address], [place], [place]    Home: [phone]    Insurance: HMO BLUE OUT IPA    PCP: Worthy Flank Referring: Worthy Flank    [facility] Facility[facility]        * * *    02/17/2019 Progress Notes: [name], MD **CHN#:** [phone]    ------    ---       **Reason for Appointment**    ---      1\. CHEMICAL BURN    ---      **History of Present Illness**    ---     _GENERAL_ :    unscheduled visit. applied DMSO to finger and this AM noted a blister.      **Current Medications**    ---    Taking    * Ceftriaxone Sodium 200 mg 1 tab Oral , Notes: for 8 days    ---    * Fluconazole 150 MG Tablet 1 tablet Orally 1 tablet a day    ---    * IVIg 800 mg infusion Oral 2x a month    ---    * Miconazole 50 MG Tablet 1 tablet Bucally Once a day    ---    * Vitamin B Complex - Tablet Orally     ---    * Vitamin C Tablet Oral     ---    * Vitamin D 2000 U 1 tab every day    ---    * WP Thyroid 32.5 MG Tablet 1 tablet on an empty stomach Orally Once a day    ---    Not-Taking/PRN    * Bactrim DS 800-160 MG Tablet 1 tablet Orally Twice a day    ---    * Diazepam 5 mg Tablet 1 tablet as needed Orally PRN    ---    * Gabapentin 100 mg Capsule 1 capsule Orally Once a day PRN    ---    * Macrobid 100 MG Capsule 1 capsule with food Orally every 12 hrs    ---    * Medication List reviewed and reconciled with the patient    ---      **Past Medical History**    ---      Systemic sclerosis.        ---      **Surgical History**    ---      Hand surgery-left elbow- rt hand calcium excision 06/23/2017    ---    Radical resection, heterotopic bone and capsule, left elbow 07/24/2017    ---    Sympathectomy radial and ulnar artery 10/28/2017    ---     Left Hand Surgery 01/14/2018    ---    Excision of calcinosis left index, left middle finger, and left ring finger  10/15/2018    ---    Excision calcinosis right wrist, right index finger, right middle finger, and  right ring finger 01/28/2019    ---      **Family History**    ---      : unknown    ---  Father: diagnosed with Diabetes mellitus without mention of complication, type  II or unspecified type, not stated as uncontrolled    ---    No autoimmune.    ---      **Social History**    ---    Tobacco  history: Never smoked.    Marital Status  Single.    Work/Occupation: full-time Consulting civil engineer.    Alcohol  Yes but rarely.     **Allergies**    ---      Keflex: Allergy    ---    Cephalexin    ---    gandolinium    ---      **Hospitalization/Major Diagnostic Procedure**    ---      No Hospitalization History.    ---      **Review of Systems**    ---     _ORT_ :    Eyes No. Ear, Nose Throat No. Digestion, Stomach, Bowel No. Bladder Problems  No. Bleeding Problems No. Numbness/Tingling No. Anxiety/Depression No.  Fever/Chills/Fatigue No. Chest Pain/Tightness/Palpitations No. Skin Rash No.  Dental Problems No. Joint/Muscle Pain/Cramps Yes. Blackout/Fainting No. Other  No.         **Vital Signs**    ---    Pain scale 1, Ht-in 5'5, Ht-cm 152.4.      **Physical Examination**    ---    Appears healthy. exam of right hand demonstrates ruptured blister of right  middle finger. No surrounding erythema. incision intact. ROM unchanged. finger  pink with good cap refill. sensation unchanged.      **Assessments**    ---    1\. Chemical burn - T30.4 (Primary)    ---    2\. Calcinosis - E83.59    ---      **Treatment**    ---      **1\. Chemical burn**    Notes: advised to stop topical DMSO use and not to apply any other topical  except bacitracin. Marland Kitchen advised to apply bacitracin BID. will continuue with  therapy. bacitracin applied, followed by bandaid. followup as scheduled.    ---     **Follow Up**    ---    as  scheduled    Electronically signed by [name] , MD on 02/21/2019 at 10:40 AM EST    Sign off status: Completed        * * *        [facility]    [address]    [place], [address]    [place], [place] [address]    Tel: [phone]    Fax: [phone]              * * *          Progress Note: [name], MD 02/17/2019    ---    Note generated by eClinicalWorks EMR/PM Software ([email or web address])

## 2019-02-17 NOTE — Progress Notes (Signed)
.  Progress Notes  .  Patient: [name], [name]  Provider: [name]    .  DOB: 13-Jun-1985 Age: 33 Y Sex: Female  .  PCP: Worthy Flank   Date: 02/17/2019  .  --------------------------------------------------------------------------------  .  REASON FOR APPOINTMENT  .  1. CHEMICAL BURN  .  HISTORY OF PRESENT ILLNESS  .  GENERAL:   unscheduled visit. applied DMSO to finger and this AM noted a  blister.  .  CURRENT MEDICATIONS  .  Taking Ceftriaxone Sodium 200 mg 1 tab Oral , Notes: for 8 days  Taking Fluconazole 150 MG Tablet 1 tablet Orally 1 tablet a day  Taking IVIg 800 mg infusion Oral 2x a month  Taking Miconazole 50 MG Tablet 1 tablet Bucally Once a day  Taking Vitamin B Complex - Tablet Orally  Taking Vitamin C Tablet Oral  Taking Vitamin D 2000 U 1 tab every day  Taking WP Thyroid 32.5 MG Tablet 1 tablet on an empty stomach  Orally Once a day  Not-Taking/PRN Bactrim DS 800-160 MG Tablet 1 tablet Orally Twice  a day  Not-Taking/PRN Diazepam 5 mg Tablet 1 tablet as needed Orally PRN  Not-Taking/PRN Gabapentin 100 mg Capsule 1 capsule Orally Once a  day PRN  Not-Taking/PRN Macrobid 100 MG Capsule 1 capsule with food Orally  every 12 hrs  Medication List reviewed and reconciled with the patient  .  PAST MEDICAL HISTORY  .  Systemic sclerosis  .  ALLERGIES  .  Keflex: Allergy  Cephalexin  gandolinium  .  SURGICAL HISTORY  .  Hand surgery-left elbow- rt hand calcium excision 06/23/2017  Radical resection, heterotopic bone and capsule, left elbow  07/24/2017  Sympathectomy radial and ulnar artery 10/28/2017  Left Hand Surgery 01/14/2018  Excision of calcinosis left index, left middle finger, and left  ring finger 10/15/2018  Excision calcinosis right wrist, right index finger, right middle  finger, and right ring finger 01/28/2019  .  FAMILY HISTORY  .  : unknown  Father: diagnosed with Diabetes mellitus without mention of  complication, type II or unspecified type, not stated as  uncontrolled  No  autoimmune.  .  SOCIAL HISTORY  .  .  Tobaccohistory:Never smoked.  .  Marital Status Single.  .  Work/Occupation: full-time Consulting civil engineer.  .  Alcohol Yes but rarely.  Marland Kitchen  HOSPITALIZATION/MAJOR DIAGNOSTIC PROCEDURE  .  No Hospitalization History.  Marland Kitchen  REVIEW OF SYSTEMS  .  ORT:  .  Eyes    No . Ear, Nose Throat    No . Digestion, Stomach, Bowel     No . Bladder Problems    No . Bleeding Problems    No .  Numbness/Tingling    No . Anxiety/Depression    No .  Fever/Chills/Fatigue    No . Chest Pain/Tightness/Palpitations     No . Skin Rash    No . Dental Problems    No . Joint/Muscle  Pain/Cramps    Yes . Blackout/Fainting    No . Other    No .  .  VITAL SIGNS  .  Pain scale 1, Ht-in 5'5, Ht-cm 152.4.  .  PHYSICAL EXAMINATION  .  Appears healthy. exam of right hand demonstrates ruptured blister  of right middle finger. No surrounding erythema. incision intact.  ROM unchanged. finger pink with good cap refill. sensation  unchanged.  .  ASSESSMENTS  .  Chemical burn - T30.4 (Primary)  .  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  TREATMENT  .  Chemical burn  Notes: advised to stop topical DMSO use and not to apply any  other topical except bacitracin. Marland Kitchen advised to apply bacitracin  BID. will continuue with therapy. bacitracin applied, followed by  bandaid. followup as scheduled.  .  FOLLOW UP  .  as scheduled  .  Electronically signed by [name] , MD on  02/21/2019 at 10:40 AM EST  .  Document electronically signed by [name]    .

## 2019-02-17 NOTE — Progress Notes (Signed)
* * *      [name], [name] **DOB:** 12-Jul-1985 (33 yo F) **Acc No.** [id number] **DOS:**  02/17/2019    ---       [name], [name]**    ------    9 Y old Female, DOB: Jun 21, 1985    [address], [address], [place], [place] [address]    Home: [phone]    Provider: [name]        * * *    Telephone Encounter    ---    Answered by  Roxanne Mins Date: 02/17/2019       Time: 08:16 AM    Caller  patient    ------            Message                     [name],            Is Dr. Rodman Pickle in clinic today? I've never had any issues with applying DMSO topically for pain or swelling and this time I made the mistake of leaving a gauze soaked with DMSO overnight and ended up with a chemical burn near the incision sites. The burn extends from the index finger to the middle finger. I called the doctor on call and she suggested I contact Rodman Pickle to see who could help with this.             Thanks,      [name]                       Action Taken                     [name],[name]  02/17/2019 8:17:07 AM > Misty Stanley, do you want to talk to her first, or do you feel she should come in this morning?      Have her come in this morning.  Thanks.      GORDON,LISA  02/17/2019 8:31:26 AM >      [name],[name]  02/17/2019 9:33:00 AM >Confirmed with patient, and she will come in this morning.                    * * *                ---          * * *         Provider: [name] 02/17/2019    ---    Note generated by eClinicalWorks EMR/PM Software ([email or web address])

## 2019-02-23 ENCOUNTER — Ambulatory Visit

## 2019-02-23 NOTE — Progress Notes (Signed)
* * *      [name], [name] **DOB:** September 09, 1985 (33 yo F) **Acc No.** [id number] **DOS:**  02/23/2019    ---       **[name], [name]**    ------    73 Y old Female, DOB: 1985-12-22    [address], [address], [place], [place] [address]    Home: [phone]    Provider: Rolinda Roan        * * *    Telephone Encounter    ---    Answered by  Rolinda Roan Date: 02/23/2019       Time: 07:34 PM    Refills Continue Fluconazole Tablet, 150 MG, Orally, 1 tablet, 1 tablet, 1  tablet a day, 1 day, Refills=0    ------          * * *                ---          * * *         Provider: Rolinda Roan 02/23/2019    ---    Note generated by eClinicalWorks EMR/PM Software ([email or web address])

## 2019-02-23 NOTE — Progress Notes (Signed)
* * *      [name], [name] **DOB:** [birth year] (33 yo F) **Acc No.** [id number] **DOS:**  02/23/2019    ---       [name], Berry**    ------    2 Y old Female, DOB: 09/01/1985    [address], [address], [place], [place] [address]    Home: [phone]    Provider: Rolinda Roan        * * *    Telephone Encounter    ---    Answered by  Rolinda Roan Date: 02/23/2019       Time: 07:31 PM    Caller  Lacee    ------            Reason  *Clinical Advice After Hours re:            Message                     Patient calling requesting yeast medication refill as she has started to experience symptoms after completing course of antibiotics. She states she has some burning, minimal discharge and no foul odor. She states she has a history of multiple yeast infections in the past and this feels similar.                 Action Taken                     Refilled the prescriptipon for one dose of fluconazole to treat a yeast infection. Instructed her to call her primary care for an evaluation due to her history of recurrent yeast infections and to follow-up with PCP if symptoms do not improve or if they worsen.                     * * *                ---          * * *         Provider: Rolinda Roan 02/23/2019    ---    Note generated by eClinicalWorks EMR/PM Software ([email or web address])

## 2019-05-11 ENCOUNTER — Ambulatory Visit

## 2019-05-21 ENCOUNTER — Ambulatory Visit: Admitting: Gastroenterology

## 2019-05-21 NOTE — Progress Notes (Signed)
 * * *    [name], [name] **DOB:** [birth year] (34 yo F) **Acc No.** [id number] **DOS:**  05/21/2019    ---       [name], [name]**    ------    34 Y old Female, DOB: 1985/08/31, External MRN: [id number]    Account Number: [id number]    [address], [address], [place], [place]    Home: [phone]    Insurance: HMO BLUE OUT IPA Payer ID: PAPER    PCP: Unknown Unknown Referring: Unknown Unknown External Visit ID: [id number]    Appointment Facility: [facility]        * * *    05/21/2019 Progress Notes: Gracy Bruins, MD **CHN#:** [phone]    ------    ---       **Current Medications**    ---    Taking    * IVIg 800 mg infusion Oral 2x a month, Notes: once a week    ---    [facility] Thyroid 32.5 MG Tablet 1 tablet on an empty stomach Orally Once a day    ---     Past Medical History    ---      Systemic sclerosis.        ---      **Surgical History**    ---      Hand surgery-left elbow- rt hand calcium excision 06/23/2017    ---    Radical resection, heterotopic bone and capsule, left elbow 07/24/2017    ---    Sympathectomy radial and ulnar artery 10/28/2017    ---    Left Hand Surgery 01/14/2018    ---    Excision of calcinosis left index, left middle finger, and left ring finger  10/15/2018    ---    Excision calcinosis right wrist, right index finger, right middle finger, and  right ring finger 01/28/2019    ---      **Family History**    ---      Father: diagnosed with Diabetes mellitus without mention of complication,  type II or unspecified type, not stated as uncontrolled    ---    Non-Contributory    ---    No autoimmune.    ---      **Social History**    ---    Tobacco history: Never smoked.    Marital Status  Single.    Work/Occupation: full-time Consulting civil engineer.    Alcohol  Yes but rarely.    Abuse/Neglect Do you feel unsafe in your relationships? No, Have you ever been  hit, kicked, punched or otherwise hurt by someone in the past year? No, Plan  Patient states they feel safe to return home.    Language:  Patient used English effectively, no interpreter needed.    Illicit drugs: Denies.     **Allergies**    ---      Keflex: Allergy    ---    Cephalexin    ---    gandolinium    ---    [Allergies Verified]      **Review of Systems**    ---     _GI/Hepatology_ :    Constitutional No recent weight change, fever, or fatigue, no night sweats.  Gastrointestinal see HPI. Neurological No frequent headaches, lightheaded or  dizziness, seizures/convulsions, tremors, numbness. Respiratory No chronic  cough/phlegm production, spitting up blood, shortness or breath, asthma or  wheezing. Hematologic/Lymphatic Not slow to heal after cuts. No bleeding or  bruising easily, enlargement of glands, or past  blood transfusion.  Integumentary (Skin, Breast) No rash or itching, change in color of skin,  change in hair or nails. Genitourinary +positive burning and frequency.  Musculoskeletal No joint pain, muscle aches, or joint swelling.          **Reason for Appointment**    ---      1\. NP/IBS    ---      **History of Present Illness**    ---     _GENERAL_ :    I had the pleasure of seeing [name] in the [facility] for evaluation of IBS in setting of scleroderma. She is a 34 yo F with  scleroderma for which she is treated with IVIG infusions. She is not on PPI  therapy. She has rare reflux and no diarrhea or incontinence. She does have a  lot of urinary symptoms and wonders if this is related to her GI tract. No  diarrhea, floating or greasy stools. She is thin, her weight has been stable  of late. She limits her diet in many ways and takes enemas at times "to feel  empty" though she has 4 bowel movements that are complete each day on average.  Has been feeling improved on Visbiome probiotic of late. She comes today to  discuss fecal transplant as an option for her.    She has been seen by multiple GI's in the past in various locations and has  had at least three normal colonoscopies. She has been  rule out for celiac and  had various stool testing in the past. I do not have these records to review  today.    No vomiting, diarrhea, hematochezia, hematemesis.     _Ambulatory Falls and Injury Prevention_ :    HPI Have you experienced a fall in the past year? No, Is the patient using  assistive devices such as a cane or walker? No, Do you need assistance with  ambulation while at our facility? No, Interventions none, patient not a fall  risk .     **Vital Signs**    ---    Pain scale 0, Ht-in 5'5, Wt-lbs 124.6, BMI 24.33, BP 107/72, HR 701, BSA 1.55,  O2 98%RA, Ht-cm 152.4, Wt-kg 56.52, Wt Change -5.4 lb.      **Physical Examination**    ---     _GI/Hepatology_ :    Constitutional thin, well nourished, alert, oriented, no acute distress. HEENT  EOMI, good dentition, Neck supple, no scleral icterus, no oral lesions.  Integumentary Negative for:, rashes, jaundice, no palmar erythema or spider  angiomata. Cardiovascular regular rate and rhythm, no murmurs, rubs or gallop.  Respiratory Clear to auscultation, no wheezes, rales, rhonchi.  Gastrointestinal Soft, thin, Nontender, nondistended, no hepatosplenomegaly,  normoactive bowel sounds, no rebound or guarding. Rectal Exam Defer.  Neurologic Cranial nerves 2-12 intact, Appropriate, no focal deficits.  Psychiatric Oriented to time, Oriented to place, Oriented to person, normal  affect.         **Assessments**    ---    1\. IBS (irritable bowel syndrome) - K58.9 (Primary)    ---     In summary, 34 year old woman with likely irritable bowel syndrome. She has  underlying scleroderma so she is at risk for esophageal dysmotility/GERD,  small intestinal bacterial overgrowth, gave, internal anal sphincter weakness.  He does not seem to have any of these issues at the moment. She comes today to  talk about fecal transplant to help with her GI issues. I explained  to her  that aside from C. difficile infection we are not allowed to give fecal  transplants unless it is in a  research protocol. She may have some intestinal  dysbiosis in the setting of multiple recent antibiotics but is not clear fecal  transplant would be helpful to her and I would hesitate to give to her given  her underlying scleroderma with IVIG treatment. We discussed the pros and cons  of using probiotics and she will continue visbiome for now. I do not suspect  that she has pancreatic insufficiency or malabsorption issues given her  current description of symptoms but we could investigate this further in the  future should she desire. We will have her follow-up in as-needed basis.    ---      **Follow Up**    ---    prn    Electronically signed by Gracy Bruins , MD on 05/21/2019 at 01:59 PM EST    Sign off status: Completed        * * *        GI Clinic    [address] [place], [address]    [place], [place] [address]    Tel: [phone]    Fax: [phone]              * * *          Progress Note: Gracy Bruins, MD 05/21/2019    ---    Note generated by eClinicalWorks EMR/PM Software ([email or web address])

## 2019-05-21 NOTE — Progress Notes (Signed)
 .  Progress Notes  .  Patient: [name], [name]  Provider: Gracy Bruins    .  DOB: [birth year] Age: 34 Y Sex: Female  .  PCP: Unknown, Unknown    Date: 05/21/2019  .  --------------------------------------------------------------------------------  .  REASON FOR APPOINTMENT  .  1. NP/IBS  .  HISTORY OF PRESENT ILLNESS  .  GENERAL:  I had the pleasure of seeing [name] in the  [facility] for evaluation of  IBS in setting of scleroderma. She is a 34 yo F with scleroderma  for which she is treated with IVIG infusions. She is not on PPI  therapy. She has rare reflux and no diarrhea or incontinence. She  does have a lot of urinary symptoms and wonders if this is  related to her GI tract. No diarrhea, floating or greasy stools.  She is thin, her weight has been stable of late. She limits her  diet in many ways and takes enemas at times "to feel empty"  though she has 4 bowel movements that are complete each day on  average. Has been feeling improved on Visbiome probiotic of late.  She comes today to discuss fecal transplant as an option for her.  She has been seen by multiple GI's in the past in various  locations and has had at least three normal colonoscopies. She  has been rule out for celiac and had various stool testing in the  past. I do not have these records to review today. No vomiting,  diarrhea, hematochezia, hematemesis.  .  Ambulatory Falls and Injury Prevention:  HPI  .  Have you experienced a fall in the past year?No , Is the patient  using assistive devices such as a cane or walker?No , Do you need  assistance with ambulation while at our facility?No ,  Interventionsnone, patient not a fall risk  .  Marland Kitchen  CURRENT MEDICATIONS  .  Taking IVIg 800 mg infusion Oral 2x a month, Notes: once a week  Taking WP Thyroid 32.5 MG Tablet 1 tablet on an empty stomach  Orally Once a day  .  PAST MEDICAL HISTORY  .  Systemic sclerosis  .  ALLERGIES  .  Keflex:  Allergy  Cephalexin  gandolinium  .  SURGICAL HISTORY  .  Hand surgery-left elbow- rt hand calcium excision 06/23/2017  Radical resection, heterotopic bone and capsule, left elbow  07/24/2017  Sympathectomy radial and ulnar artery 10/28/2017  Left Hand Surgery 01/14/2018  Excision of calcinosis left index, left middle finger, and left  ring finger 10/15/2018  Excision calcinosis right wrist, right index finger, right middle  finger, and right ring finger 01/28/2019  .  FAMILY HISTORY  .  Father: diagnosed with Diabetes mellitus without mention of  complication, type II or unspecified type, not stated as  uncontrolled  Non-Contributory  No autoimmune.  .  SOCIAL HISTORY  .  .  Tobaccohistory:Never smoked.  .  Marital Status Single.  .  Work/Occupation: full-time Consulting civil engineer.  .  Alcohol Yes but rarely.  .  Abuse/NeglectDo you feel unsafe in your relationships?No , Have  you ever been hit, kicked, punched or otherwise hurt by someone  in the past year? No , PlanPatient states they feel safe to  return home.  .  Language: Patient used English effectively, no interpreter  needed.  .  Illicit drugs: Denies.  Marland Kitchen  REVIEW OF SYSTEMS  .  GI/Hepatology:  .  Constitutional    No recent  weight change, fever, or fatigue, no  night sweats . Gastrointestinal    see HPI . Neurological    No  frequent headaches, lightheaded or dizziness,  seizures/convulsions, tremors, numbness . Respiratory    No  chronic cough/phlegm production, spitting up blood, shortness or  breath, asthma or wheezing . Hematologic/Lymphatic    Not slow to  heal after cuts. No bleeding or bruising easily, enlargement of  glands, or past blood transfusion . Integumentary (Skin, Breast)     No rash or itching, change in color of skin, change in hair or  nails . Genitourinary    +positive burning and frequency .  Musculoskeletal    No joint pain, muscle aches, or joint swelling  .  Marland Kitchen  VITAL SIGNS  .  Pain scale 0, Ht-in 5'5, Wt-lbs 124.6, BMI 24.33, BP 107/72, HR  701,  BSA 1.55, O2 98%RA, Ht-cm 152.4, Wt-kg 56.52, Wt Change -5.4  lb.  .  PHYSICAL EXAMINATION  .  GI/Hepatology:  Constitutional  thin, well nourished, alert, oriented, no acute  distress. HEENT  EOMI, good dentition, Neck supple, no scleral  icterus, no oral lesions. Integumentary  Negative for:, rashes,  jaundice, no palmar erythema or spider angiomata. Cardiovascular   regular rate and rhythm, no murmurs, rubs or gallop. Respiratory   Clear to auscultation, no wheezes, rales, rhonchi.  Gastrointestinal  Soft, thin, Nontender, nondistended, no  hepatosplenomegaly, normoactive bowel sounds, no rebound or  guarding. Rectal Exam  Defer. Neurologic  Cranial nerves 2-12  intact, Appropriate, no focal deficits. Psychiatric  Oriented to  time, Oriented to place, Oriented to person, normal affect.  .  ASSESSMENTS  .  IBS (irritable bowel syndrome) - K58.9 (Primary)  .  In summary, 34 year old woman with likely irritable bowel  syndrome. She has underlying scleroderma so she is at risk for  esophageal dysmotility/GERD, small intestinal bacterial  overgrowth, gave, internal anal sphincter weakness. He does not  seem to have any of these issues at the moment. She comes today  to talk about fecal transplant to help with her GI issues. I  explained to her that aside from C. difficile infection we are  not allowed to give fecal transplants unless it is in a research  protocol. She may have some intestinal dysbiosis in the setting  of multiple recent antibiotics but is not clear fecal transplant  would be helpful to her and I would hesitate to give to her given  her underlying scleroderma with IVIG treatment. We discussed the  pros and cons of using probiotics and she will continue visbiome  for now. I do not suspect that she has pancreatic insufficiency  or malabsorption issues given her current description of symptoms  but we could investigate this further in the future should she  desire. We will have her follow-up in as-needed  basis.  .  FOLLOW UP  .  prn  .  Electronically signed by Gracy Bruins , MD on  05/21/2019 at 01:59 PM EST  .  Document electronically signed by Gracy Bruins    .

## 2019-07-26 ENCOUNTER — Ambulatory Visit: Admitting: Hand Surgery

## 2019-07-26 ENCOUNTER — Ambulatory Visit (HOSPITAL_BASED_OUTPATIENT_CLINIC_OR_DEPARTMENT_OTHER): Admitting: Psychiatry

## 2019-07-26 ENCOUNTER — Ambulatory Visit

## 2019-07-26 NOTE — Progress Notes (Signed)
 * * *    [name], [name] **DOB:** May 30, 1985 (33 yo F) **Acc No.** [id number] **DOS:**  07/26/2019    ---       [name], [name]**    ------    56 Y old Female, DOB: April 24, 1985, External MRN: [id number]    Account Number: [id number]    [address], [address], [place], [place]    Home: [phone]    Insurance: HMO BLUE OUT IPA    PCP: [name] Fuse, MD Referring: Unknown Unknown    [facility] Facility[facility]        * * *    07/26/2019  **Appointment Provider:** [name]    ------     **Supervising Provider:** [name] Numbers, MD    ---       **Reason for Appointment**    ---      1\. s/p excision calcinosis right wrist, IF, MF, RF DOS 01/28/19    ---      **History of Present Illness**    ---     _GENERAL_ :    [name] is a 34 year old female who returns today for new calcific deposit  concerns. She had excision of multiple lesions now about 6 months ago. She is  doing fairly well from those sites. She now has two new areas of concern. On  the left hand she has a painful calcific deposit at the tip of the middle  finger that she feels is working its way through the skin. On the right hand  she has painful tender area on the ulnar side of the PIP joint. This site  feels like pins and needles and has some diminished sensation just distal to  it.      **Current Medications**    ---    Taking    * IVIg 800 mg infusion Oral 2x a month, Notes: once a week    ---    * WP Thyroid 32.5 MG Tablet 1 tablet on an empty stomach Orally Once a day    ---    * Medication List reviewed and reconciled with the patient    ---      **Past Medical History**    ---      Systemic sclerosis.        ---      **Surgical History**    ---      Hand surgery-left elbow- rt hand calcium excision 06/23/2017    ---    Radical resection, heterotopic bone and capsule, left elbow 07/24/2017    ---    Sympathectomy radial and ulnar artery 10/28/2017    ---    Left Hand Surgery 01/14/2018    ---     Excision of calcinosis left index, left middle finger, and left ring finger  10/15/2018    ---    Excision calcinosis right wrist, right index finger, right middle finger, and  right ring finger 01/28/2019    ---      **Family History**    ---      Father: diagnosed with Diabetes mellitus without mention of complication,  type II or unspecified type, not stated as uncontrolled    ---    No autoimmune.    ---      **Social History**    ---    Tobacco  history: Never smoked.    Marital Status  Single.    Work/Occupation: full-time Consulting civil engineer.    Alcohol  Yes but rarely.  Abuse/Neglect  Do you feel unsafe in your relationships? No, Plan Patient  states they feel safe to return home, Have you ever been hit, kicked, punched  or otherwise hurt by someone in the past year? No.    Language: Patient used English effectively, no interpreter needed.    Illicit drugs: Denies.     **Allergies**    ---      Keflex: Allergy    ---    Cephalexin    ---    gandolinium    ---      **Hospitalization/Major Diagnostic Procedure**    ---      No Hospitalization History.    ---      **Review of Systems**    ---     _ORT_ :    Eyes No. Ear, Nose Throat No. Digestion, Stomach, Bowel No. Bladder Problems  No. Bleeding Problems No. Numbness/Tingling No. Anxiety/Depression No.  Fever/Chills/Fatigue No. Chest Pain/Tightness/Palpitations No. Skin Rash No.  Dental Problems No. Joint/Muscle Pain/Cramps Yes. Blackout/Fainting No. Other  No.         **Vital Signs**    ---    Pain scale 0, Ht-in 5'5, Ht-cm 152.4.      **Physical Examination**    ---    NAD, AOx3, SILT m/u/r, palpable radial pulses    The right index finger PIP joint has two small raised areas over the ulnar  aspect that are tender and red, feels like a calcified nodule, diminished  sensation just distal to this area but intact throughout the remainder of the  procedure. Right middle finger tip has a palpable nodule under the skin.    Imaging: xrays of the bilateral hands  were taken and personally reviewed  today, they demonstrate calcification over the middle finger tip on the right  that corresponds with the palpable nodule on exam and left index PIP joint has  calcification ulnarly that also correlates with exam.      **Assessments**    ---    1\. Calcinosis - E83.59 (Primary)    ---      **Treatment**    ---      **1\. Calcinosis**    Notes:    Patient was seen and examined with Dr. Rodman Pickle. Diagnosis and treatment  options were discussed. She is very tender and sensitive over the right PIP  joint, possibly some irritation of the small dorsal branch of the sensory  nerve. We prescribed a silicone pad to try over the tender area to help with  abrasion against the adjacent digit. The left middle finger tip calcific  nodule is not threatening the skin at this point. We recommend waiting as long  as we can before proceeding with surgery, which she was agreeable to. Follow  up as needed.    .    ---     **Follow Up**    ---    prn    **Appointment Provider:** [name]    Electronically signed by [name] , MD on 10/11/2019 at 07:23 AM EDT    Sign off status: Completed        * * *        [facility]    [address]    [place], [address]    [place], [place] [address]    Tel: [phone]    Fax: [phone]              * * *  Progress Note: [name] 07/26/2019    ---    Note generated by eClinicalWorks EMR/PM Software ([email or web address])

## 2019-07-26 NOTE — Progress Notes (Signed)
 .  Progress Notes  .  Patient: [name], [name]  Provider: Randel Pigg  DOB:[birth year] Age: 34 Y Sex: Female  Supervising Provider:: [name], MD  Date: 07/26/2019  .  PCP: Reina Fuse  MD  Date: 07/26/2019  .  --------------------------------------------------------------------------------  .  REASON FOR APPOINTMENT  .  1. s/p excision calcinosis right wrist, IF, MF, RF DOS 01/28/19  .  HISTORY OF PRESENT ILLNESS  .  GENERAL:   [name] is a 34 year old female who returns today for new  calcific deposit concerns. She had excision of multiple lesions  now about 6 months ago. She is doing fairly well from those  sites. She now has two new areas of concern. On the left hand she  has a painful calcific deposit at the tip of the middle finger  that she feels is working its way through the skin. On the right  hand she has painful tender area on the ulnar side of the PIP  joint. This site feels like pins and needles and has some  diminished sensation just distal to it.  .  CURRENT MEDICATIONS  .  Taking IVIg 800 mg infusion Oral 2x a month, Notes: once a week  Taking WP Thyroid 32.5 MG Tablet 1 tablet on an empty stomach  Orally Once a day  Medication List reviewed and reconciled with the patient  .  PAST MEDICAL HISTORY  .  Systemic sclerosis  .  ALLERGIES  .  Keflex: Allergy  Cephalexin  gandolinium  .  SURGICAL HISTORY  .  Hand surgery-left elbow- rt hand calcium excision 06/23/2017  Radical resection, heterotopic bone and capsule, left elbow  07/24/2017  Sympathectomy radial and ulnar artery 10/28/2017  Left Hand Surgery 01/14/2018  Excision of calcinosis left index, left middle finger, and left  ring finger 10/15/2018  Excision calcinosis right wrist, right index finger, right middle  finger, and right ring finger 01/28/2019  .  FAMILY HISTORY  .  Father: diagnosed with Diabetes mellitus without mention of  complication, type II or unspecified type, not stated as  uncontrolled  No  autoimmune.  .  SOCIAL HISTORY  .  .  Tobaccohistory:Never smoked.  .  Marital Status Single.  .  Work/Occupation: full-time Consulting civil engineer.  .  Alcohol Yes but rarely.  .  Abuse/NeglectDo you feel unsafe in your relationships?No ,  PlanPatient states they feel safe to return home , Have you ever  been hit, kicked, punched or otherwise hurt by someone in the  past year? No.  .  Language: Patient used English effectively, no interpreter  needed.  .  Illicit drugs: Denies.  Marland Kitchen  HOSPITALIZATION/MAJOR DIAGNOSTIC PROCEDURE  .  No Hospitalization History.  Marland Kitchen  REVIEW OF SYSTEMS  .  ORT:  .  Eyes    No . Ear, Nose Throat    No . Digestion, Stomach, Bowel     No . Bladder Problems    No . Bleeding Problems    No .  Numbness/Tingling    No . Anxiety/Depression    No .  Fever/Chills/Fatigue    No . Chest Pain/Tightness/Palpitations     No . Skin Rash    No . Dental Problems    No . Joint/Muscle  Pain/Cramps    Yes . Blackout/Fainting    No . Other    No .  .  VITAL SIGNS  .  Pain scale 0, Ht-in 5'5, Ht-cm 152.4.  .  PHYSICAL  EXAMINATION  .  NAD, AOx3, SILT m/u/r, palpable radial pulsesThe right index  finger PIP joint has two small raised areas over the ulnar aspect  that are tender and red, feels like a calcified nodule,  diminished sensation just distal to this area but intact  throughout the remainder of the procedure. Right middle finger  tip has a palpable nodule under the skin. Imaging: xrays of the  bilateral hands were taken and personally reviewed today, they  demonstrate calcification over the middle finger tip on the right  that corresponds with the palpable nodule on exam and left index  PIP joint has calcification ulnarly that also correlates with  exam.  .  ASSESSMENTS  .  Calcinosis - E83.59 (Primary)  .  TREATMENT  .  Calcinosis  Notes:  .  Patient was seen and examined with Dr. Rodman Pickle. Diagnosis and  treatment options were discussed. She is very tender and  sensitive over the right PIP joint, possibly some irritation  of  the small dorsal branch of the sensory nerve. We prescribed a  silicone pad to try over the tender area to help with abrasion  against the adjacent digit. The left middle finger tip calcific  nodule is not threatening the skin at this point. We recommend  waiting as long as we can before proceeding with surgery, which  she was agreeable to. Follow up as needed.  .  .  .  FOLLOW UP  .  prn  .  Marland Kitchen  Appointment Provider: Randel Pigg  .  Electronically signed by [name] , MD on  10/11/2019 at 07:23 AM EDT  .  CONFIRMATORY SIGN OFF  .  Marland Kitchen  Document electronically signed by Randel Pigg    .

## 2020-04-21 ENCOUNTER — Ambulatory Visit

## 2020-06-02 ENCOUNTER — Ambulatory Visit

## 2020-06-02 ENCOUNTER — Ambulatory Visit: Admitting: Hand Surgery

## 2020-06-02 NOTE — Progress Notes (Signed)
 .  Progress Notes  .  Patient: [name], [name]  Provider: Amalia Greenhouse    .  DOB: 09-10-1985 Age: 35 Y Sex: Female  Supervising Provider:: [name], MD  Date: 06/02/2020  .  PCP: Reina Fuse  MD  Date: 06/02/2020  .  --------------------------------------------------------------------------------  .  REASON FOR APPOINTMENT  .  1. Left middle finger tip calcinosis  .  2. Bilateral small finger PIP contractures  .  3. Right index finger PIP calcinosis  .  HISTORY OF PRESENT ILLNESS  .  GENERAL:   35 year old female with systemic sclerosis presents for  evaluation of bilateral hand involvement. She was last seen in  office in April 2021. She has previously under gone calcinosis  excisions involving her left elbow, bilateral wrists, and  multiple digits on the bilateral hands as well as bilateral hands  sympathectomies. She presents today specifically to discuss  calcinosis to the middle fingertip of her left hand in addition  to irritation over the ulnar aspect of her right index finger PIP  joint, and bilateral small finger PIP contractures. She reports  her perfusion has been good to all fingers. Of note she is very  happy about she was able to swim in cold water this winter  without issues. In terms of her left middle finger tip calcinosis  she reports that for 5 months ago this area opened and is now  draining milky/chalky appearing fluid on a daily basis.  Marland Kitchen  CURRENT MEDICATIONS  .  Taking IVIg 800 mg infusion Oral 2x a month, Notes: once a week  Taking WP Thyroid 32.5 MG Tablet 1 tablet on an empty stomach  Orally Once a day  .  REVIEW OF SYSTEMS  .  ORT:  .  Eyes    No . Ear, Nose Throat    No . Digestion, Stomach, Bowel     No . Bladder Problems    No . Bleeding Problems    No .  Numbness/Tingling    No . Anxiety/Depression    No .  Fever/Chills/Fatigue    No . Chest Pain/Tightness/Palpitations     No . Skin Rash    No . Dental Problems    No . Joint/Muscle  Pain/Cramps    Yes . Blackout/Fainting     No . Other    No .  .  PHYSICAL EXAMINATION  .  NAD, AOx3, SILT m/u/r, palpable radial pulsesThe right index  finger PIP joint has a small raised areas over the ulnar aspect  that is tender and red, feels like a calcified nodule, diminished  sensation just distal to this area but intact throughout the  remainder of the procedure. Left middle finger tip has a palpable  nodule under the skin, with opening of the skin and a small  amount of chalky calcific material deep to this. There are 90  degree contractures of the small finger PIP joints bilaterally  with approximately 10 degree arc of motion at these joints. She  has well maintained motion at the small finger MCP and DIP  joints.  .  ASSESSMENTS  .  Systemic sclerosis - M34.9 (Primary)  .  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  TREATMENT  .  Systemic sclerosis  Notes: Patient was seen and examined with Dr. Rodman Pickle. She is a  35 year old female with systemic sclerosis here to discuss  multiple issues today. Primarily she has left middle finger tip  calcinosis that has eroded the skin is now draining calcific  material on a daily basis. She also has irritation over the ulnar  aspect of her right index finger PIP joint, likely related to  recurrent calcific deposit. Finally she has bilateral small  finger PIP contractures. We recommend first addressing the left  middle finger calcinosis surgically which would include excision.  In the same setting we could address her small finger PIP  contracture on the left. We discussed possible surgical options  which would include a PIP fusion versus a digit widget. We  discussed that fusion would result in her PIP joint being fixed  at a specific degree of flexion. In contrast the digit widget  would ideally help her regain motion at the PIP joint, however it  requires more extensive involvement including bone pins through  the skin that would remain in place for 6 weeks. She would have  to keep the sites clean and dry. She would like to try  the digit  widget for the left small finger PIP contracture. We discussed  all risks of surgery including bleeding, infection, damage to  structures, pain, fracture, pin tract infection, pin site  complications, wound complications, recurrent contracture,  stiffness. She understands and wishes to proceed with surgery, we  will schedule her for left middle finger excision of calcinosis  and left small finger PIP digit widget. She will follow up with  [place] postoperatively.  .  FOLLOW UP  .  in OR  .  Marland Kitchen  Appointment Provider: Amalia Greenhouse, MD  .  Electronically signed by [name] , MD on  06/19/2020 at 08:15 AM EDT  .  CONFIRMATORY SIGN OFF  .  Marland Kitchen  Document electronically signed by Amalia Greenhouse    .

## 2020-06-27 NOTE — Progress Notes (Signed)
* * *        **  [name], [name]**    --- ---    65 Y old Female, DOB: 1985-09-20    [address], [place], [place] [address]    Home: [phone]    Provider: [name]        * * *    Telephone Encounter    ---    Answered by   Archer Asa  Date: 03/06/2016         Time: 01:55 PM    Caller   [name]    --- ---            Reason   Second opinion            Message                      Good afternoon,      The patient is having surgery next week on both hands and would like to get a second opinion from Dr. Rodman Pickle before. Please call the patient back at [phone].            Thank you=)                Action Taken   Allen,Kara 03/06/2016 1:57:18 PM > [name],[name] 03/06/2016  2:48:01 PM > , Action - Pt telephoned. Spoke to pt.                * * *                ---          * * *          Patient: [name], [name] DOB: 1985-08-20 Provider: [name] 03/06/2016    ---    Note generated by eClinicalWorks EMR/PM Software ([email or web address])

## 2020-06-27 NOTE — Progress Notes (Signed)
* * *        **  Pottle, Levada**    --- ---    1 Y old Female, DOB: 03/11/1986    [address], [place], [place] [address]    Home: [phone]    Provider: [name]        * * *    Telephone Encounter    ---    Answered by   Loel Lofty  Date: 12/19/2017         Time: 03:34 PM    Caller   patient    --- ---            Reason   surgery            Message                      Patient is calling to schedule surgery with Dr. Rodman Pickle. Contact Amauria at [phone], thank you.                Action Taken                      Benoit,Kyara  12/19/2017 3:37:10 PM >       Leighton Roach  12/22/2017 11:48:11 AM > hello pt is calling and would like to move date of surgery if possible?She would like a call back to discuss this with you      yhanks      [name],[name]  12/22/2017 1:06:18 PM > spoke to patient. I am unable to move up her surgery at this time. She is scheduled for 01/14/18                          * * *                ---          * * *          Patient: Cullers, Lee DOB: 1985/04/04 Provider: [name] 12/19/2017    ---    Note generated by eClinicalWorks EMR/PM Software ([email or web address])

## 2020-06-27 NOTE — Progress Notes (Signed)
* * *        **Anguiano, Denaja**    --- ---    35 Y old Female, DOB: 22-Jul-1985, External MRN: [id number]    Account Number: [id number]    [address] [place], [place]    Home: [phone]    Insurance: HMO BLUE OUT [employer]    PCP: Greg Cutter, MD Referring: Greg Cutter, MD    [facility]        * * *    03/22/2016   **Appointment Provider:** Crist Infante **CHN#:** [id number]    --- ---      **Supervising Provider:** [name], MD    ---        Reason for Appointment    ---      1\. Scleroderma    ---      History of Present Illness    ---     _GENERAL_ :    The patient is a 35 year old female with past medical history significant for  scleroderma with painful subcutaneous calcium deposition in her digits and her  left elbow. She is here to discuss the imaging results before her planned  surgery 12/28.      Current Medications    ---    Taking     * IVIg 800 mg infusion Oral 2x a month    ---    * Vitamin B Complex - Tablet Orally     ---    * Vitamin C Tablet Oral     ---    * Vitamin D 2000 U 1 tab every day    ---    * WP Thyroid 32.5 MG Tablet 1 tablet on an empty stomach Orally Once a day    ---      Past Medical History    ---       Medical History Verified.Marland Kitchen        ---      Surgical History    ---      Hand surgery-left elbow- rt hand calcium excision    ---      Family History    ---       : unknown    ---    No autoimmune.    ---      Social History    ---    Tobacco    history: _Never smoked_    Marital Status    _Single_    Work/Occupation: full-time Consulting civil engineer.    Alcohol    _Yes but rarely_       Allergies    ---      Keflex: Allergy    ---    Cephalexin    ---      Hospitalization/Major Diagnostic Procedure    ---      No Hospitalization History.    ---      Review of Systems    ---     _ORT_ :    Eyes No. Ear, Nose Throat No. Digestion, Stomach, Bowel No. Bladder Problems  No. Bleeding Problems No. Numbness/Tingling No.  Anxiety/Depression No.  Fever/Chills/Fatigue No. Chest Pain/Tightness/Palpitations No. Skin Rash No.  Dental Problems No. Joint/Muscle Pain/Cramps Yes. Blackout/Fainting No. Other  No.          Vital Signs    ---    Pain scale 10, Ht-in 5'5.      Examination    ---     _GENERAL_ :  On examination today, she is a well-appearing pleasant female in no apparent  distress. On examination of her left elbow, she has obvious deformity noted.  She has range of motion from approximately negative 35 degrees full extension  with near full flexion and full rotation. With regards to her hand, she has  limited digital range of motion both lacking full extension as well as full  flexion. She has relatively well maintained MP range of motion. She has a  large calcium deposit at the tip of her right middle finger and clinically the  nail appears curved. She has smaller deposits evident at the tip of the right  ring finger and the left small finger. She also has a nodule on the dorsal  ulnar aspect of her right wrist.    RADIOGRAPHS: Radiographs that the patient brought with her of her bilateral  hands are reviewed by [place] today. These are dated 03/2016. We also obtained an  x-ray of her right middle finger today. These demonstrate a large calcium  deposit at the tip of the right middle finger with minimal bone remaining  supporting the nail. She also has a deposition at the level of her volar PIP  joint of the right middle finger. There is smaller calcium depositions at the  tip of her left small finger and right ring finger as well as her right dorsal  wrist.          Assessments    ---    1\. Scleroderma - M34.9 (Primary)    ---      Treatment    ---       **1\. Others**    Notes: 35yo planned for excision of calcific masses of R MF/RF, dorsal wrist  and L elbow. Dr. Rodman Pickle has explained treatment options and she wants to  proceed with excision of 3 masses on R hand: R MF/RF and dorsal wrist. We  discussed that she could undergo  MF DIP fusion after she heals from this  surgery, likely 6 months. She wants tramadol post op.    ---      Follow Up    ---    post op    **Appointment Provider:** [facility]    Electronically signed by [name] , MD on 03/23/2016 at 11:09 PM EST    Sign off status: Completed        * * *        [facility]    [address]    [place], [place] [address]    Tel: [phone]    Fax: [phone]              * * *          Patient: KASIDI, SHANKER DOB: [birth year] Progress Note: Vernia Teem  03/22/2016    ---    Note generated by eClinicalWorks EMR/PM Software ([email or web address])

## 2020-06-27 NOTE — Progress Notes (Signed)
* * *        **  [name], [name]**    --- ---    12 Y old Female, DOB: October 20, 1985    [address], [place], [place] [address]    Home: [phone]    Provider: [name]        * * *    Telephone Encounter    ---    Answered by   Gwynne Edinger  Date: 05/13/2017         Time: 02:48 PM    Caller   Patient    --- ---            Reason   Email-Callback            Message                      Hello,            Pt would like to know if she can have work done on her elbow and her hand at the same time instead of coming in for 2 seperate surgerys. Please email the pt back [email or web address] or contact at the number on file.            Thank you.                Action Taken                      Crocker,Anthony  05/13/2017 2:51:09 PM >                     * * *                ---          * * *          Patient: Voland, Seylah DOB: Sep 26, 1985 Provider: [name] 05/13/2017    ---    Note generated by eClinicalWorks EMR/PM Software ([email or web address])

## 2020-06-27 NOTE — Progress Notes (Signed)
* * *        **[name], [name]**    --- ---    23 Y old Female, DOB: [birth year], External MRN: [id number]    Account Number: [id number]    [address], [name]    Home: [phone]    Insurance: [employer]    PCP: [name], MD Referring: [name], MD    [facility]        * * *    01/26/2018  Progress Notes: [name], MD **CHN#:** [id number]    --- ---    ---         **Reason for Appointment**    ---       1\. POST OP PER IAN    ---       **History of Present Illness**    ---     _GENERAL_ :    hand is feeling better. on right, middle finger calcinosis is worsening as  well as radial DIP small finger. On left, she wonders whether something can be  done to correct small finger PIP contracture.       **Current Medications**    ---    Taking     * Diazepam 5 mg Tablet 1 tablet as needed Orally PRN    ---    * Gabapentin 100 mg Capsule 1 capsule Orally Once a day PRN    ---    * IVIg 800 mg infusion Oral 2x a month    ---    * Vitamin B Complex - Tablet Orally     ---    * Vitamin C Tablet Oral     ---    * Vitamin D 2000 U 1 tab every day    ---    Not-Taking/PRN    * Macrobid 100 MG Capsule 1 capsule with food Orally every 12 hrs    ---    * WP Thyroid 32.5 MG Tablet 1 tablet on an empty stomach Orally Once a day    ---    * Medication List reviewed and reconciled with the patient    ---       **Past Medical History**    ---       Systemic sclerosis.        ---       **Surgical History**    ---       Hand surgery-left elbow- rt hand calcium excision 06/23/2017    ---    Radical resection, heterotopic bone and capsule, left elbow 07/24/2017    ---    Sympathectomy radial and ulnar artery 10/28/2017    ---    Left Hand Surgery 01/14/2018    ---       **Family History**    ---       : unknown    ---    Father: diagnosed with Diabetes    ---    No autoimmune.    ---       **Social History**    ---    Work/Occupation: full-time Consulting civil engineer.    Alcohol   Yes but rarely.    Marital Status  Single.    Tobacco  history: Never smoked.      **Allergies**    ---       Keflex: Allergy    ---    Cephalexin    ---    gandolinium    ---       **  Hospitalization/Major Diagnostic Procedure**    ---       No Hospitalization History.    ---       **Review of Systems**    ---     _ORT_ :    Eyes No. Ear, Nose Throat No. Digestion, Stomach, Bowel No. Bladder Problems  No. Bleeding Problems No. Numbness/Tingling No. Anxiety/Depression No.  Fever/Chills/Fatigue No. Chest Pain/Tightness/Palpitations No. Skin Rash No.  Dental Problems No. Joint/Muscle Pain/Cramps Yes. Blackout/Fainting No. Other  No.          **Vital Signs**    ---    Pain scale 0, Ht-in 5'5, Ht-cm 152.4.       **Physical Examination**    ---    appears healthy. left hand incisions healing without signs of infection, PIP  small finger rests in 60 degrees flexion, correctable to 40. on right,  defmrmity of pulp/nail of midlle finger with firm calcinosis. small finger  calcinosis radial side.       **Assessments**    ---    1\. Scleroderma - M34.9 (Primary)    ---    2\. Raynaud''s phenomenon without gangrene - I73.00    ---       **Treatment**    ---       **1\. Scleroderma**    Notes: most of sutures removed today, leaving palm sutures in place. she will  have them removed on Friday. nature of PIP contracture in scleroderma  reviewed. I explained that surgery to straighten is complex and would likely  result in loss of ability to make fist. we will try serial casting. also given  Rx for resting right hand splint. she would liketo proceed with calcinosis  excision for right middle and small fingers. risks of surgery, including but  not limited to infection,ntip necrosis, wound healing problems, recurrence  explained. i explained that the nail deformity is duee to bone loss and is not  expected to resiolve. she would like to proceed.    ---      **Follow Up**    ---    friday    Electronically signed by [name] ,  MD on 01/27/2018 at 06:36 AM EDT    Sign off status: Completed        * * *        [facility]    [address]    [place], [place] [address]    Tel: [phone]    Fax: [phone]              * * *          Patient: [name], [name] DOB: [birth year] Progress Note: [name], MD  01/26/2018    ---    Note generated by eClinicalWorks EMR/PM Software ([email or web address])

## 2020-06-27 NOTE — Progress Notes (Signed)
* * *        **  [name], [name]**    --- ---    51 Y old Female, DOB: 03/11/1986    [address] [place], [place] [address]    Home: [phone]    Provider: [name]        * * *    Telephone Encounter    ---    Answered by   Andrey Spearman  Date: 10/10/2017         Time: 03:51 PM    Reason   Waiting for call back    --- ---            Message                      Good Afternoon,       [name] is calling in regards to her surgical appt with Dr. Rodman Pickle. She can be reached at, [phone].                             Action Taken                      [name],[name]  10/10/2017 3:52:27 PM >       Thank you       [name],[name]  10/22/2017 11:06:14 AM > Patient called again to confirm her surgical appointment w/ Dr. Rodman Pickle.      [name],[name]  10/22/2017 11:35:18 AM > Email sent to patient with answers.                    * * *                ---          * * *          Patient: [name], [name] DOB: [birth year] Provider: [name] 10/10/2017    ---    Note generated by eClinicalWorks EMR/PM Software ([email or web address])

## 2020-06-27 NOTE — Progress Notes (Signed)
* * *        **[name], [name]**    --- ---    29 Y old Female, DOB: 10/13/1985, External MRN: [id number]    Account Number: [id number]    [address], [name]    Home: [phone]    Insurance: [facility] OUT [employer] HMO Payer ID: PAPER    PCP: Unknown Unknown Referring: Unknown Unknown External Visit ID: [id number]    Appointment Facility: Otolaryngology        * * *    10/03/2017  Progress Notes: Armando Gang, MD **CHN#:** [id number]    --- ---    ---         **Current Medications**    ---    Taking     * doxycycline 100MG      ---    * IVIg 800 mg infusion Oral 2x a month    ---    * Macrobid 100 MG Capsule 1 capsule with food Orally every 12 hrs    ---    * Vitamin B Complex - Tablet Orally     ---    * Vitamin C Tablet Oral     ---    * Vitamin D 2000 U 1 tab every day    ---    * WP Thyroid 32.5 MG Tablet 1 tablet on an empty stomach Orally Once a day    ---      Past Medical History    ---       Systemic sclerosis.        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    ---       **Social History**    ---    Work/Occupation: full-time Consulting civil engineer.    Alcohol  Yes but rarely.    Marital Status  Single.    Tobacco history: Never smoked.      **Allergies**    ---       Keflex: Allergy    ---    Cephalexin    ---    gandolinium    ---       **Review of Systems**    ---     _ENT_ :    Unxplained weight loss: No. Change in Vision: No. Dizziness: No. Chest Pain:  No. Diarrhea: No. Muscle or joint pain: No. Kidney/bladder problems: No.  Weakness or Fatigue: No. Fevers/Night Sweats: No. Heat/Cold Intolerance: No.  Shortness of Breath: No. Bleeding/bruising easily: No. Constipation: No.  Trouble Swallowing: No. Headaches: No. Numbness of face/hands/feet: No.             **Reason for Appointment**    ---       1\. NP/CONSULT;HX RHINOPLASTY OVERSEAS; DIFFICULT BREATHING, CPT    ---    2\. ALee nasal congestion    ---       **History of Present Illness**    ---     _GENERAL_ :    35 year old woman with a history of stable scleroderma presents with a  acquired nasal deformity. She underwent septorhinoplasty in [place] in August  2018. She is now able to breathe through the nose but is unhappy with the  appearance. She sees significant asymmetries of the tip and the dorsum. She is  concerned about an over-rotated and under projected nasal tip. The columella  is asymmetric and hanging. The nose is twisted. The patient denies anosmia,  epistaxis, rhinorrhea, vision changes, nasal pain, easy bruising or bleeding.       **Vital Signs**    ---    Pain scale 0, Ht-in 5'5, Ht-cm 152.4.       **Physical Examination**    ---     _General Examination_ :    General Appearance well developed, well nourished, alert, oriented, in no  acute distress, breathing comfortably without stridor or dyspnea. Head  normocephalic.    _ENT Examination :_ :    Speech and affect are normal. Face : there is not palpable sinus tenderness  and facial strenght is intact and symmetric. Facial symmetry is Normal. Skin:  Fitzpatrick type III skin, There are no skin lesions I could appreciate. Thin  contracted skin consisent with scleroderma. Nose: Dorsum is well supportet.  persistent open roof. Tip is underprojected and over rotated. Right alar  retraction. Tip over narrowed. s/p base reduction. Nasal skin is thin. Radix  is normal. Bony pyrmid is relatively midline with visible lateral ridges. The  left bone is more medialized. Middle vault is depressed on the right. Base is  is s/p reduction.    _ENT Nasal Cavity :_ :    Septum is midline. Caudal septum is partially resected. Inferior Turbinates  were normal.    _Skin/Neck/Lymph_ :    Lymph nodes normal. Larynx, trachea (eg, position, crepitus, tenderness)  normal.     _Neuro_ :    Facial strength (HB Grade) 1. Other crainial nerves intact (2-6, 8-12) normal.    _Ears, Nose, Mouth and Throat_ :    Pinna and Auricle normal. External auditory canals normal. Tympanic  membrances/TM mobility, retraction, effusion normal. External appearance of  nose (deformity, scars, lesions) normal. Nasal mucosa, septum, turbinate  normal. Lips, teeth, gums normal. Appearance of oral mucosa normal. Posterior  pharyngeal wall normal. Normal soft palate and uvula length.    _Eyes_ :    Ocular motility normal.          **Assessments**    ---    1\. 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 She will need to nasal tip reconstructed to address the  overrotation and alar retraction. She will need spreader grafts for the middle  vault as well as likely camouflaging grafts with cartilage, temporalis fascia,  or acellular dermis. She may need osteotomies as well. We discussed need for  possible auricular and costal cartilage harvest. The procedures were discussed  with the patient in detail. Also discussed were some of the risks, which  include, but are not limited to bleeding, infection, reaction to anesthesia,  septal perforation, continued symptoms, recurrence of symptoms, anosmia, poor  cosmetic outcome, pneumothorax, and the possible need for further procedures.  All questions were answered. She will follow up prn.    ---       **Procedures**    ---    After verbal consent was obtained, the nasal cavities were examined with a 0  degree rigid endoscope. The scope was placed into the  right nasal cavity.  There was no evidence of polypoid masses or purulence. The septum was without  perforations. It was palpated with a q tip and there appears to be a submucous  resection site. The caudal and dorsal strut are about 1.5 cm. The scope was  placed into the left nasal cavity. There was no evidence of polypoid masses  or  purulence. There were no mucosa lesions on either side.    The patient tolerated the procedure well.       **Procedure Codes**    ---       13086 NASAL ENDOSCOPY, DX    ---       **Follow Up**    ---    prn    Electronically signed by Rusty Aus , MD on 10/03/2017 at 01:02 PM EDT    Sign off status: Completed        * * *        Otolaryngology    [address]    [place]    [place], [place] [address]    Tel: [phone]    Fax: [phone]              * * *          Patient: [name], [name] DOB: 08-20-1985 Progress Note: Armando Gang, MD  10/03/2017    ---    Note generated by eClinicalWorks EMR/PM Software ([email or web address])

## 2020-06-27 NOTE — Progress Notes (Signed)
* * *        **[name]**    --- ---    53 Y old Female, DOB: 05/05/1985, External MRN: [id number]    Account Number: [id number]    [address] [id number]    Home: [phone]    Insurance: [facility] OUT IPA HMO    PCP: Talbert Forest, MD Referring: Talbert Forest, MD    Appointment Facility: [facility]        * * *    07/14/2017   **Appointment Provider:** Lorenda Ishihara, MD **CHN#:** [id number]    --- ---      **Supervising Provider:** [name], MD    ---         **Reason for Appointment**    ---       1\. Scleroderma    ---    2\. Left elbow contracture with calcification deposits    ---       **History of Present Illness**    ---     _GENERAL_ :    [name] returns to clinic today for follow-up. She postponed her surgery because  she was traveling to [place] and [place]. She was unable to tolerate the  cold in [place] during the wintertime given her scleroderma and severe  Raynaud's phenomenon. During her travels, she unfortunately contracted UTI and  was started on doxycycline and Macrobid which caused a flareup of the  calcifications in her left elbow with some increased drainage. She was very  diligent about her wound care and she has managed to keep her left elbow wound  from getting infected. With regard to her symptoms, she continues to have  significant limitation with her left elbow extension, and is also concerned  about her likelihood of wound healing. She also notes right wrist dorsal  calcification as well as volar middle finger calcification on the right hand,  which she would like to address in the future after her left elbow surgery.  She would like to proceed with a left elbow surgery as soon as possible at  this time.       **Current Medications**    ---    Taking     * doxycycline 100MG      ---    * IVIg 800 mg infusion Oral 2x a month    ---    * Macrobid 100 MG Capsule 1 capsule with food Orally every 12 hrs    ---    * Vitamin B Complex - Tablet Orally      ---    * Vitamin C Tablet Oral     ---    * Vitamin D 2000 U 1 tab every day    ---    * WP Thyroid 32.5 MG Tablet 1 tablet on an empty stomach Orally Once a day    ---       **Past Medical History**    ---       Medical History Verified..        ---       **Surgical History**    ---       Hand surgery-left elbow- rt hand calcium excision    ---      **Family History**    ---       : unknown    ---    No autoimmune.    ---       **Social History**    ---    Tobacco  history: _Never smoked_    Marital Status    _Single_    Work/Occupation: Physicist, medical.    Alcohol    _Yes but rarely_       **Allergies**    ---       Keflex: Allergy    ---    Cephalexin    ---       **Hospitalization/Major Diagnostic Procedure**    ---       No Hospitalization History.    ---       **Review of Systems**    ---     _ORT_ :    Eyes No. Ear, Nose Throat No. Digestion, Stomach, Bowel No. Bladder Problems  No. Bleeding Problems No. Numbness/Tingling No. Anxiety/Depression No.  Fever/Chills/Fatigue No. Chest Pain/Tightness/Palpitations No. Skin Rash No.  Dental Problems No. Joint/Muscle Pain/Cramps Yes. Blackout/Fainting No. Other  No.          **Vital Signs**    ---    Pain scale 7, Ht-in 5'5, Ht-cm 152.4.       **Physical Examination**    ---    She is a well-appearing woman in no acute distress. Examination of her left  elbow demonstrates a linear surgical incision with a 5 mm opening with a wide  base and very scant drainage of thick white discharge. There is no surrounding  erythema or significant swelling. Elbow active range of motion is from 30-140  degrees of flexion and full pronosupination. Distally, her sensation is intact  to light touch in the median, radial and ulnar distributions. She does have  delayed capillary refill in the digits, but they are all warm and pink.  Digital motion is limited throughout.    Examination of the right wrist with dorsal ulnar nodule which is approximately  1 cm diameter, nontender.  Middle finger with hard nodule at the volar aspect  of the PIP joint which is somewhat tender and limiting her motion. She also  has a fixed contracture of the DIP joint of the middle finger.       **Assessments**    ---    1\. 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 Scleroderma - M34.9    ---       **Treatment**    ---       **1\. Others**    Notes: [name] is a 35 year old woman with scleroderma and a left elbow  contracture with significant calcium deposition posteriorly. We have planned  for a radical resection of the calcifications through an open posterior  approach. We did explain to her that once these have been removed, there will  be some redundancy of the skin, which will allow [place] to close the wound over  the small opening without the need for any skin graft. The surgery will be  scheduled for the next 1-2 weeks pending availability. With regard to her  right wrist and hand, we will plan to address these after she has recovered  from her left elbow surgery. Following her elbow surgery, she will have a  drain placed in the wound with a splint, which will be removed on postop day  1. She will be admitted for 23-hour observation and will begin therapy on the  day after surgery.    ---      **Follow Up**    ---    for surgery    **Appointment Provider:** Lorenda Ishihara, MD    Electronically signed by [name] ,  MD on 07/21/2017 at 08:09 AM EDT    Sign off status: Completed        * * *        [facility]    [address]    [place], [address]    [place], [place] [address]    Tel: [phone]    Fax: [phone]              * * *          Patient: [name], [name] DOB: 08-18-1985 Progress Note: Lorenda Ishihara, MD  07/14/2017    ---    Note generated by eClinicalWorks EMR/PM Software ([email or web address])

## 2020-06-27 NOTE — Progress Notes (Signed)
* * *        **  [name], [name]**    --- ---    50 Y old Female, DOB: Oct 02, 1985    [address] [place], [place] [address]    Home: [phone]    Provider: [name]        * * *    Telephone Encounter    ---    Answered by   Wilburt Finlay  Date: 09/05/2017         Time: 09:07 AM    Caller   [name], pt    --- ---            Reason   Wrist Pain            Message                      Morning,             [name] is experiencing some excruciating pain in her RT wrist is looking to see if she can come in today to be evaluated by Dr. Rodman Pickle. She states that she had surgery sometime around the end of March and due to the painful wrist, she is not able to any daily activities. Currently unable to move her hand. She is going to Urgent Care but she knows that she is going to be advised to see her Hand Specialist. [name] can be reached at, [phone].             Thank you!                 Action Taken                      Gilles,Guerbie  09/05/2017 9:12:49 AM >       [name],[name]  09/05/2017 10:34:44 AM > , Action - Pt telephoned.  Spoke to pt.                    * * *                ---          * * *          Patient: [name], [name] DOB: 1986-01-17 Provider: [name] 09/05/2017    ---    Note generated by eClinicalWorks EMR/PM Software ([email or web address])

## 2020-06-27 NOTE — Progress Notes (Signed)
* * *        **  [name], [name]**    --- ---    64 Y old Female, DOB: May 09, 1985    [address] [place], [place] [address]    Home: [phone]    Provider: [name]        * * *    Telephone Encounter    ---    Answered by   Teddy Spike  Date: 09/15/2017         Time: 11:29 AM    Caller   Patient    --- ---            Reason   Call back            Message                      Hi,            Patient want to let Erasmo Score know she is receiving blood work tomorrow, and she received the kidney function test last week. Patient is requesting to speak to [facility] to see what other test Dr. Rodman Pickle want her to take. Patient can be reached at [phone].            Thanks                Action Taken                      [name],[name]  09/15/2017 11:35:11 AM >       [name],[name]  09/15/2017 11:48:13 AM > Action - Pt telephoned.  Spoke to pt.                    * * *                ---          * * *          Patient: [name], [name] DOB: Apr 05, 1985 Provider: [name] 09/15/2017    ---    Note generated by eClinicalWorks EMR/PM Software ([email or web address])

## 2020-06-27 NOTE — Progress Notes (Signed)
* * *        **[name], [name]**    --- ---    42 Y old Female, DOB: 1985-04-12, External MRN: [id number]    Account Number: [id number]    [address] [name]    Home: [phone]    Insurance: [employer] OUT IPA HMO    PCP: [name], MD Referring: [name], MD    Appointment Facility: [facility]        * * *    09/10/2017  Progress Notes: [name], MD **CHN#:** [phone]    --- ---    ---         **Reason for Appointment**    ---       1\. R WRIST PAIN    ---    2\. Poor circulation bilateral hands    ---       **History of Present Illness**    ---     _GENERAL_ :    doing well with respect to elbow. developed right wrist swelling and pain was  seen elsewhere, had negative aspiration for infection. pain has been  improving. motion limited but improving. most worried about bilateral hand  circulation. intermittent bluish discoloration of digits, worse on the right,  especially when in cold environments. also worried about tip of middle finger  which feels very tight.       **Current Medications**    ---    Taking     * doxycycline 100MG      ---    * IVIg 800 mg infusion Oral 2x a month    ---    * Macrobid 100 MG Capsule 1 capsule with food Orally every 12 hrs    ---    * Vitamin B Complex - Tablet Orally     ---    * Vitamin C Tablet Oral     ---    * Vitamin D 2000 U 1 tab every day    ---    * WP Thyroid 32.5 MG Tablet 1 tablet on an empty stomach Orally Once a day    ---    * Medication List reviewed and reconciled with the patient    ---       **Past Medical History**    ---       Medical History Verified..        ---       **Surgical History**    ---       Hand surgery-left elbow- rt hand calcium excision    ---    Radical resection, heterotopic bone and capsule, left elbow 07/24/2017    ---       **Family History**    ---       : unknown    ---    No autoimmune.    ---       **Social History**    ---    Work/Occupation: full-time Consulting civil engineer.    Alcohol  Yes  but rarely.    Marital Status  Single.    Tobacco  history: Never smoked.      **Allergies**    ---       Keflex: Allergy    ---    Cephalexin    ---       **Hospitalization/Major Diagnostic Procedure**    ---       Denies Past Hospitalization    ---       **Review of Systems**    ---  _ORT_ :    Eyes No. Ear, Nose Throat No. Digestion, Stomach, Bowel No. Bladder Problems  No. Bleeding Problems No. Numbness/Tingling No. Anxiety/Depression No.  Fever/Chills/Fatigue No. Chest Pain/Tightness/Palpitations No. Skin Rash No.  Dental Problems No. Joint/Muscle Pain/Cramps No. Blackout/Fainting No. Other  No.          **Vital Signs**    ---    Pain scale 6, Ht-in 5'5, Ht-cm 152.4.       **Physical Examination**    ---    appears healthy. left elbow incision well healed, ROM -15/140. right wrist:  prominence over LT area, nontender. limited wrist motion. Pulses: palpable  right radial and ulnar at wrist. palpable ulnar, absent radial on left. bluish  discoloration digits both hands, worse on the right. deformity right middle  finger with hooked nail and flexed DIP. skin intact. delayed digital cap  refill bilaterally. sensation mildly decreased bilaterally in digits.    Xrays of right wrist reviewed by me demonstrate calcinosis over LT joint and  chondrocalcinosis of TFCC.       **Assessments**    ---    1\. 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 Scleroderma - M34.9    ---    3\. Raynaud''s phenomenon without gangrene - I73.00    ---    4\. 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 Heterotopic ossification**    Notes: with respect to left elbow, encouraged to continue with exercises. with  respect to right wrist, I reviewed Xrays with her. The inflammatory reaction  appears to be improving, and she was instructed on ROM. with respect to hands,  she appears to have small vessel disease related to her scleroderma. She may  be a candidate for digital sympathectomy. I  have recommended a right upper  extremity arteriogram with runoff to assess arch and digital circulaiton. She  would like to proceed. With respect to middle finger, I am concerned that  debridement of calcinosis will jeopardize digit. will defer until angiogram.    ---      **Follow Up**    ---    after angiogram    Electronically signed by [name] , MD on 09/14/2017 at 09:51 PM EDT    Sign off status: Completed        * * *        [facility]    [address]    [place], [address]    [place], [place] [address]    Tel: [phone]    Fax: [phone]              * * *          Patient: [name] DOB: 05/30/1985 Progress Note: [name], MD  09/10/2017    ---    Note generated by eClinicalWorks EMR/PM Software ([email or web address])

## 2020-06-27 NOTE — Progress Notes (Signed)
* * *        **  [name], [name]**    --- ---    54 Y old Female, DOB: 06-Sep-1985    [address], [place], [place] [address]    Home: [phone]    Provider: [name]        * * *    Telephone Encounter    ---    Answered by   Gwynne Edinger  Date: 05/22/2017         Time: 10:21 AM    Caller   Patient    --- ---            Reason   Callback            Message                      Hello,            Pt has her CT scans available so she would like to schedule her surgery or at least see the doctor to see what he thinks about the scans but the pt cannot wait till the end of March to see the doctor. Please contact back at [phone].            Thank you.                Action Taken                      Crocker,Anthony  05/22/2017 10:25:09 AM >       [name],[name]  05/23/2017 10:24:08 AM > , Action - pt telephoned.  Left message      [name],[name]  05/26/2017 1:16:02 PM > , Action - Pt telephoned.  Spoke to pt. Scheduled appointment for 06/04/17                    * * *                ---          * * *          Patient: [name], [name] DOB: 01-Sep-1985 Provider: [name] 05/22/2017    ---    Note generated by eClinicalWorks EMR/PM Software ([email or web address])

## 2020-06-27 NOTE — Progress Notes (Signed)
* * *        **Flood, Loriann**    --- ---    69 Y old Female, DOB: [birth year], External MRN: [id number]    Account Number: [id number]    [address] [place], [place]    Home: [phone]    Insurance: HMO BLUE OUT IPA    PCP: Greg Cutter, MD Referring: Greg Cutter, MD    Appointment Facility: [facility]        * * *    03/15/2016   **Appointment Provider:** Herbert Deaner, [facility] **CHN#:** [phone]    --- ---      **Supervising Provider:** [name], MD    ---        Reason for Appointment    ---      1\. Scleroderma    ---      History of Present Illness    ---     _GENERAL_ :    The patient is a 35 year old female with past medical history significant for  scleroderma with painful subcutaneous calcium deposition in her digits and her  left elbow. She is treated currently with IVIG. With regards to her left  elbow, she has had excision of her calcium deposition approximately 4 years  ago with recurrence. She also notes excision of calcium deposition at her  right middle fingertip 2 times once about 1 year ago and previously about 4  years ago, again with recurrence. She also notes a painful deposition at her  right ring fingertip and her left small fingertip. At this point, her fingers  are her primary complaint that she would prefer to address first. She denies  any issues with healing in the past. She does do hyperbaric oxygen as well.  She also mentions a painful right dorsal wrist nodule.      Current Medications    ---    Taking     * IVIg 500 mg 1 tab Oral 2x a month    ---    * Vitamin B Complex - Tablet Orally     ---    * Vitamin C Tablet Oral     ---    * Vitamin D 2000 U 1 tab every day    ---    * WP Thyroid 32.5 MG Tablet 1 tablet on an empty stomach Orally Once a day    ---    * Medication List reviewed and reconciled with the patient    ---      Past Medical History    ---       Medical History Verified.Marland Kitchen        ---      Surgical History    ---      Hand  surgery-left elbow- rt hand calcium excision    ---      Family History    ---       : unknown    ---    No autoimmune.    ---      Social History    ---    Tobacco  history: Never smoked.    Marital Status  Single.    Work/Occupation: full-time Consulting civil engineer.    Alcohol  Yes but rarely.      Allergies    ---      Keflex: Allergy    ---    Cephalexin    ---      Hospitalization/Major Diagnostic Procedure    ---  No Hospitalization History.    ---      Review of Systems    ---     _ORT_ :    Eyes No. Ear, Nose Throat No. Digestion, Stomach, Bowel No. Bladder Problems  No. Bleeding Problems No. Numbness/Tingling No. Anxiety/Depression No.  Fever/Chills/Fatigue No. Chest Pain/Tightness/Palpitations No. Skin Rash No.  Dental Problems No. Joint/Muscle Pain/Cramps Yes. Blackout/Fainting No. Other  No.          Vital Signs    ---    Pain scale 10, Ht-in 5'5, Wt-lbs 130.      Examination    ---     _GENERAL_ :    On examination today, she is a well-appearing pleasant female in no apparent  distress. On examination of her left elbow, she has obvious deformity noted.  She has range of motion from approximately negative 35 degrees full extension  with near full flexion and full rotation. With regards to her hand, she has  limited digital range of motion both lacking full extension as well as full  flexion. She has relatively well maintained MP range of motion. She has a  large calcium deposit at the tip of her right middle finger and clinically the  nail appears curved. She has smaller deposits evident at the tip of the right  ring finger and the left small finger. She also has a nodule on the dorsal  ulnar aspect of her right wrist.    RADIOGRAPHS: Radiographs that the patient brought with her of her bilateral  hands are reviewed by [place] today. These are dated 03/2016. We also obtained an  x-ray of her right middle finger today. These demonstrate a large calcium  deposit at the tip of the right middle finger with minimal bone  remaining  supporting the nail. She also has a deposition at the level of her volar PIP  joint of the right middle finger. There is smaller calcium depositions at the  tip of her left small finger and right ring finger as well as her right dorsal  wrist.          Assessments    ---    1\. Scleroderma - M34.9 (Primary)    ---      Treatment    ---       **1\. Others**    Notes: The patient was seen today with Dr. Rodman Pickle. Again, she is interested  in excision of calcium deposition. 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 She also would like to think about possible distal  amputation of her right middle finger. She will meet with Chiquita Loth today  about the possibility of prosthesis for her right middle finger. She will then  make a decision as to whether she would like to proceed with amputation versus  excision of calcification. She will be scheduled for surgery at her  convenience. The risks and benefits of surgery were discussed.    ---      Follow Up    ---    In OR    **Appointment Provider:** Herbert Deaner, [facility]    Electronically signed by [name] , MD on 10/03/2016 at 06:17 PM EDT    Sign off status: Completed        * * *        [facility]    [address]  [place], [place] [address]    Tel: [phone]    Fax: [phone]              * * *          Patient: [name], [name] DOB: 1986-02-01 Progress Note: Herbert Deaner, [facility]  03/15/2016    ---    Note generated by eClinicalWorks EMR/PM Software ([email or web address])

## 2020-06-27 NOTE — Progress Notes (Signed)
* * *        **  Gidney, Lynsie**    --- ---    41 Y old Female, DOB: Aug 20, 1985    [address], [place], [place] [address]    Home: [phone]    Provider: [name]        * * *    Telephone Encounter    ---    Answered by   Park Liter  Date: 08/15/2016         Time: 02:11 PM    Caller   Ceclia    --- ---            Reason   Call Back            Message                      Hi -      Patient is requesting a call back from Dr. Delsa Grana coordinator because she has a few questions before scheduling an appointment. Please call back at [phone].            Thank you.                Action Taken   [facility] 08/15/2016 2:14:11 PM > I called [name] back. I  left her a VM to call the office back. [name],[name] 08/19/2016 2:33:24 PM >                * * *                ---          * * *          Patient: Barnette, Fayelynn DOB: May 24, 1985 Provider: [name] 08/15/2016    ---    Note generated by eClinicalWorks EMR/PM Software ([email or web address])

## 2020-06-27 NOTE — Progress Notes (Signed)
* * *        **  [name], [name]**    --- ---    2 Y old Female, DOB: 01-Dec-1985    [address] [place], [place] [address]    Home: [phone]    Provider: [name]        * * *    Telephone Encounter    ---    Answered by   Charyl Dancer  Date: 09/12/2017         Time: 11:52 AM    Caller   Lucretia Roers    --- ---            Reason   Call back            Message                      Hi,      Janela from interventional radiology is requesting a call back from you regarding the patient. Please call her ext. at [phone].      Thank you                 Action Taken                      Frimpong,Eric  09/12/2017 11:53:45 AM >       [name],[name]  09/12/2017 12:52:12 PM > Spoke to [place]. Angiogram scheduled for 09/17/17 @ 1pm.                    * * *                ---          * * *          Patient: [name], [name] DOB: December 28, 1985 Provider: [name] 09/12/2017    ---    Note generated by eClinicalWorks EMR/PM Software ([email or web address])

## 2020-06-27 NOTE — Progress Notes (Signed)
* * *        **Edmonston, Eulala**    --- ---    42 Y old Female, DOB: 14-Aug-1985, External MRN: [id number]    Account Number: [id number]    [address] [place]    Home: [phone]    Insurance: [employer]    PCP: [name], MD Referring: [name], MD    Appointment Facility: [facility]        * * *    03/27/2018  Progress Notes: [name], MD **CHN#:** [phone]    --- ---    ---         **Reason for Appointment**    ---       1\. Follow-up    ---       **History of Present Illness**    ---     _GENERAL_ :    minimal pain. cold intolerance improved. would like small finger PIP  contractures addressed.       **Current Medications**    ---    Taking     * Gabapentin 100 mg Capsule 1 capsule Orally Once a day PRN    ---    * IVIg 800 mg infusion Oral 2x a month    ---    * Vitamin B Complex - Tablet Orally     ---    * Vitamin C Tablet Oral     ---    * Vitamin D 2000 U 1 tab every day    ---    Not-Taking/PRN    * Diazepam 5 mg Tablet 1 tablet as needed Orally PRN    ---    * Macrobid 100 MG Capsule 1 capsule with food Orally every 12 hrs    ---    * WP Thyroid 32.5 MG Tablet 1 tablet on an empty stomach Orally Once a day    ---    * Medication List reviewed and reconciled with the patient    ---       **Past Medical History**    ---       Systemic sclerosis.        ---       **Surgical History**    ---       Hand surgery-left elbow- rt hand calcium excision 06/23/2017    ---    Radical resection, heterotopic bone and capsule, left elbow 07/24/2017    ---    Sympathectomy radial and ulnar artery 10/28/2017    ---    Left Hand Surgery 01/14/2018    ---       **Family History**    ---       : unknown    ---    Father: diagnosed with Diabetes mellitus without mention of complication, type  II or unspecified type, not stated as uncontrolled    ---    No autoimmune.    ---       **Social History**    ---    Work/Occupation: full-time Consulting civil engineer.    Alcohol   Yes but rarely.    Marital Status  Single.    Tobacco  history: Never smoked.      **Allergies**    ---       Keflex: Allergy    ---    Cephalexin    ---    gandolinium    ---       **Hospitalization/Major Diagnostic Procedure**    ---  Denies Past Hospitalization    ---       **Review of Systems**    ---     _ORT_ :    Eyes No. Ear, Nose Throat No. Digestion, Stomach, Bowel No. Bladder Problems  No. Bleeding Problems No. Numbness/Tingling No. Anxiety/Depression No.  Fever/Chills/Fatigue No. Chest Pain/Tightness/Palpitations No. Skin Rash No.  Dental Problems No. Joint/Muscle Pain/Cramps No. Blackout/Fainting No. Other  No.          **Vital Signs**    ---    Pain scale 0, Ht-in 5'5, Ht-cm 152.4.       **Physical Examination**    ---    appears healthy. incisions well healed. PIP contractures bilat small fingers.  good cap refill.       **Assessments**    ---    1\. Scleroderma - M34.9 (Primary)    ---    2\. 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 Scleroderma**    Notes: sutures removed. to be seen by OT for serial casting.    ---      **Follow Up**    ---    4 Weeks    Electronically signed by [name] , MD on 04/04/2018 at 06:48 AM EST    Sign off status: Completed        * * *        [facility]    [address]    [place], [address]    [place], [place] [address]    Tel: [phone]    Fax: [phone]              * * *          Patient: BENTLEE, DRIER DOB: 01-19-1986 Progress Note: [name], MD  03/27/2018    ---    Note generated by eClinicalWorks EMR/PM Software ([email or web address])

## 2020-06-27 NOTE — Progress Notes (Signed)
* * *        **  [name], [name]**    --- ---    48 Y old Female, DOB: 10-Oct-1985    [address] [place], [place] [address]    Home: [phone]    Provider: [name]        * * *    Telephone Encounter    ---    Answered by   Archer Asa  Date: 09/26/2016         Time: 10:15 AM    Caller   Adriene    --- ---            Reason   Call back            Message                      Good morning,      The patient is following up regarding Dr. Rodman Pickle doing  surgery on her elbow and rt hand. Please call the patient back at [phone]                      Action Taken   Allen,Kara 09/26/2016 10:15:41 AM > [name],[name] 09/30/2016  9:08:03 AM > , Action - pt telephoned. Left message Douzable,Cafiane 09/30/2016  10:00:12 AM > Patient is requesting a call back. Please contact patient at  phone:[phone]. Thank you She called back [name],[name] 09/30/2016  1:07:33 PM > [name],[name] 10/01/2016 9:00:40 AM > Called back. Message on  recording says "person can not accept calls at this time. Unable to leave  message.                * * *                ---          * * *          Patient: [name], [name] DOB: 1985/11/25 Provider: [name] 09/26/2016    ---    Note generated by eClinicalWorks EMR/PM Software ([email or web address])

## 2020-06-27 NOTE — Progress Notes (Signed)
* * *        **  [name], [name]**    --- ---    68 Y old Female, DOB: 01/30/1986    [address], [place], [place] [address]    Home: [phone]    Provider: [name]        * * *    Telephone Encounter    ---    Answered by   Wilburt Finlay  Date: 09/29/2017         Time: 02:33 PM    Caller   [name], pt    --- ---            Reason   Surgery            Message                      Good Afternoon,             [name] is calling in regards to see how far surgery is booking. She can be reached at, [phone].             Thank you!                 Action Taken                      Gilles,Guerbie  09/29/2017 2:34:43 PM > Called back. Received a message that "person I was calling was not accepting calls at this time". Unable to leave a message.                    * * *                ---          * * *          Patient: [name], [name] DOB: 1986/01/17 Provider: [name] 09/29/2017    ---    Note generated by eClinicalWorks EMR/PM Software ([email or web address])

## 2020-06-27 NOTE — Progress Notes (Signed)
* * *        **  Picone, Maguire**    --- ---    36 Y old Female, DOB: Nov 06, 1985    [address] [place], [place] [address]    Home: [phone]    Provider: Armando Gang        * * *    Telephone Encounter    ---    Answered by   Neldon Mc  Date: 06/09/2017         Time: 04:33 PM    Caller   [name]    --- ---            Reason   medical advice            Message                      Patient calling looking to schedule an appointment with Dr. Nedra Hai regarding [place]" and is now c/o discoloration to the tip of her nose that started 3 days ago w/ numbness presenting today. Consulted with PA and informed patient she should go to her nearest emergency department, they can order any necessary imaging tests, patient will f/u with me after ER visit. Patient is aware Dr. Nedra Hai is out of the office until the week of 06/16/2017.                 Action Taken                      Roberts,Kwana  06/09/2017 4:39:27 PM >                     * * *                ---          * * *          Patient: Wence, Dakisha DOB: 04-12-1985 Provider: Armando Gang 06/09/2017    ---    Note generated by eClinicalWorks EMR/PM Software ([email or web address])

## 2020-06-27 NOTE — Progress Notes (Signed)
* * *        **Earnhardt, Zariana**    --- ---    35 Y old Female, DOB: 1985-06-06, External MRN: [id number]    Account Number: [id number]    [address], [name]    Home: [phone]    Insurance: [employer]    PCP: [name], MD Referring: [name], MD    Appointment Facility[facility]        * * *    02/02/2018   **Appointment Provider:** Dorian Pod **CHN#:** [id number]    --- ---      **Supervising Provider:** [name], MD    ---         **Reason for Appointment**    ---       1\. 01/14/18 S/P SYMPATHECTOMY, LEFT SUPERFICIAL ARCH    ---    2\. SYMPATHECTOMY, LEFT RADIAL AND ULNAR ARTERY    ---    3\. SYMPATHECTOMY OF COMMON DIGITAL ARTERY, LEFT IF/MF/RF    ---    4\. EXCISION OF CALCINOSIS DEEP, LEFT SF    ---       **History of Present Illness**    ---     _GENERAL_ :    35yo female is now 2.5 weeks post left hand surgery. She has been compliant  with her restrictions and instructions. On right, middle finger calcinosis is  worsening as well as radial DIP small finger, and will like to plan surgery  today. She denies fevers, chills or other issues at this time.       **Current Medications**    ---    Taking     * Gabapentin 100 mg Capsule 1 capsule Orally Once a day PRN    ---    * IVIg 800 mg infusion Oral 2x a month    ---    * Vitamin B Complex - Tablet Orally     ---    * Vitamin C Tablet Oral     ---    * Vitamin D 2000 U 1 tab every day    ---    Not-Taking/PRN    * Diazepam 5 mg Tablet 1 tablet as needed Orally PRN    ---    * Macrobid 100 MG Capsule 1 capsule with food Orally every 12 hrs    ---    * WP Thyroid 32.5 MG Tablet 1 tablet on an empty stomach Orally Once a day    ---    * Medication List reviewed and reconciled with the patient    ---       **Past Medical History**    ---       Systemic sclerosis.        ---       **Surgical History**    ---       Hand surgery-left elbow- rt hand calcium excision 06/23/2017    ---    Radical  resection, heterotopic bone and capsule, left elbow 07/24/2017    ---    Sympathectomy radial and ulnar artery 10/28/2017    ---    Left Hand Surgery 01/14/2018    ---       **Family History**    ---       : unknown    ---    Father: diagnosed with Diabetes    ---    No autoimmune.    ---       **Social  History**    ---    Work/Occupation: full-time Consulting civil engineer.    Alcohol  Yes but rarely.    Marital Status  Single.    Tobacco  history: Never smoked.      **Allergies**    ---       Keflex: Allergy    ---    Cephalexin    ---    gandolinium    ---       **Hospitalization/Major Diagnostic Procedure**    ---       No Hospitalization History.    ---       **Review of Systems**    ---     _ORT_ :    Eyes No. Ear, Nose Throat No. Digestion, Stomach, Bowel No. Bladder Problems  No. Bleeding Problems No. Numbness/Tingling No. Anxiety/Depression No.  Fever/Chills/Fatigue No. Chest Pain/Tightness/Palpitations No. Skin Rash No.  Dental Problems No. Joint/Muscle Pain/Cramps Yes. Blackout/Fainting No. Other  No.          **Vital Signs**    ---    Pain scale 0, Ht-in 5'5, Ht-cm 152.4.       **Physical Examination**    ---    35yo female, well-appearing, in no acute distress. Exam of the left hand  incisions healing without signs of infection, PIP small finger rests in 60  degrees flexion, correctable to 40.    Exam of right, defmrmity of pulp/nail of midlle finger with firm calcinosis.  small finger calcinosis radial side.       **Assessments**    ---    1\. Scleroderma - M34.9 (Primary)    ---    2\. Raynaud''s phenomenon without gangrene - I73.00    ---       **Treatment**    ---       **1\. Scleroderma**    Notes: The patient was seen and evalauted with Dr Rodman Pickle. She is now 2.5  weeks post the aforementioned surgeries and doing well. The reamaining sutures  were removed and replaced with steri-strips. She may get the incision wet. She  will continue OT working on ROM. In regards the RUE, We will schedule a  surgical excision of  calcinosis of the MF and SF. Risks of surgery were  discussed including bleeding, infection, damage to nearby vessels, nerves,  structures, stiffness, intraop fracture, need for future procedures, and  incomplete relief of symptoms. The patient expressed understanding of these  risks and wished to proceed. All questions were answered.    ---      **Follow Up**    ---    surgery    **Appointment Provider:** Dorian Pod    Electronically signed by [name] , MD on 02/27/2018 at 09:31 AM EST    Sign off status: Completed        * * *        [facility]    [address]    [place], [address]    [place], [place] [address]    Tel: [phone]    Fax: [phone]              * * *          Patient: MELLONY, DANZIGER DOB: Apr 01, 1986 Progress Note: Dorian Pod 02/02/2018    ---    Note generated by eClinicalWorks EMR/PM Software ([email or web address])

## 2020-06-27 NOTE — Progress Notes (Signed)
* * *        **Lundstrom, [name]**    --- ---    35 Y old Female, DOB: 04-07-1985, External MRN: [id number]    Account Number: [id number]    [address] [id number]    Home: [phone]    Insurance: HMO BL[employer] OUT IPA    PCP: Greg Cutter, MD Referring: Greg Cutter, MD    Appointment Facility: [facility]        * * *    02/24/2017   **Appointment Provider:** Dorian Pod **CHN#:** [id number]    --- ---      **Supervising Provider:** [name], MD    ---        Reason for Appointment    ---      1\. LT ELBOW WOUND FU    ---      History of Present Illness    ---     _GENERAL_ :    Ms. Soberanes is a pleasant 35 year old female with past medical history of  scleroderma with painful subcutaneous calcium deposition of her left elbow,  presents for evaluation of a left elbow wound. She reports while in [place], a  subcutaneous calcium deposition broke through the skin and had surgery done to  remove the calcium deposition and wound closure. She believes the sutures were  removed prematurely causing the wound to open. New sutures were placed,  however, it did not fully close the wound. She reports of continued mild  drainage from the wound, as serosanguineous in nature rather than purulent  discharge. She denies fevers. She endorses numbness and paresthesias of the  left hand more noticeably on the middle finger. She denies trauma or any other  issues at this time.      Current Medications    ---    Taking     * IVIg 800 mg infusion Oral 2x a month    ---    * Vitamin B Complex - Tablet Orally     ---    * Vitamin C Tablet Oral     ---    * Vitamin D 2000 U 1 tab every day    ---    * WP Thyroid 32.5 MG Tablet 1 tablet on an empty stomach Orally Once a day    ---    * Medication List reviewed and reconciled with the patient    ---      Past Medical History    ---       Medical History Verified.Marland Kitchen        ---      Surgical History    ---      Hand surgery-left elbow- rt hand  calcium excision    ---      Family History    ---       : unknown    ---    No autoimmune.    ---      Social History    ---    Tobacco  history: Never smoked.    Marital Status  Single.    Work/Occupation: full-time Consulting civil engineer.    Alcohol  Yes but rarely.      Allergies    ---      Keflex: Allergy    ---    Cephalexin    ---      Hospitalization/Major Diagnostic Procedure    ---      No Hospitalization History.    ---  Review of Systems    ---     _ORT_ :    Eyes No. Ear, Nose Throat No. Digestion, Stomach, Bowel No. Bladder Problems  No. Bleeding Problems No. Numbness/Tingling No. Anxiety/Depression No.  Fever/Chills/Fatigue No. Chest Pain/Tightness/Palpitations No. Skin Rash No.  Dental Problems No. Joint/Muscle Pain/Cramps Yes. Blackout/Fainting No. Other  No.          Vital Signs    ---    Pain scale 7, Ht-in 5'5, Ht-cm 152.4.      Physical Examination    ---    35yo female, well-appearing, in no acute distress. On Examination of the left  elbow reveals skin iindurated. There is a 2cm wound posterolateral aspect of  the elbow, with signs of minimal drainage. There is NO erythema, edema. There  is subcuteneous calcification surrounging the wound. Mild Tenderness  surrounding the wound. ROM: lacks 30 deg of full extension, has full flexion,  able to pronosupinate. Positive Tinels and wrist compression tests. Fires  EDC/FDS/FDP/EPL/FPL. Neurologic sensation intact to light touch in M/U/R  distributions. Hand is warm and well perfused with palpable radial pulse.    IMAGING: Xrays of left elbow reviewed by [place] today reveal no acute fracture or  dislocation. There is progressive clustered calcification deposition posterior  of elbow.      Assessments    ---    1\. Open wound of left elbow, subsequent encounter - S51.002D (Primary)    ---    2\. Scleroderma - M34.9 (Primary)    ---      Treatment    ---       **1\. Open wound of left elbow, subsequent encounter**    Notes: The patient was seen and evaluated with Dr.  Rodman Pickle. A 35 year old  female presents with open surgical wound with minimal discharge secondary to  excision of a subcutaneous calcium deposition. On exam, there is a 2 cm wound  with minimal clear discharge and imaging revealed large amount of calcium  deposits most noticeably on the posterior aspect of the elbow. Findings,  diagnosis, and nature of the conditions were discussed with the patient again.  We also had a long discussion regarding treatment options including  conservative and more aggressive measurements. We recommend surgical  intervention with a radical calcium deposition excision. After discussing  risks, benefits, and alternatives answering all questions, the patient was  agreeable to proceed with surgery. Risks include, but were not limited to  infection, pain, stiffness, neurovascular injury, recurrence, wound healing  problems, and the need for further surgery. The anticipated postoperative  rehabilitation was reviewed with the patient as well. Our administrator will  meet with the patient to schedule a surgical date. She voices agreement with  the assessment and plan. Questions were answered.    ---      Follow Up    ---    surgery    **Appointment Provider:** Dorian Pod    Electronically signed by [name] , MD on 03/16/2017 at 06:58 PM EST    Sign off status: Completed        * * *        [facility]    [address]    [place], [place] [address]    Tel: [phone]    Fax: [phone]              * * *          Patient: AIRIS, BARBEE DOB: 04/15-1987 Progress Note: Dorian Pod  02/24/2017    ---    Note generated by eClinicalWorks EMR/PM Software ([email or web address])

## 2020-06-27 NOTE — Progress Notes (Signed)
* * *        **[name], [name]**    --- ---    16 Y old Female, DOB: 07-19-1985, External MRN: [id number]    Account Number: [id number]    [address], [name]    Home: [phone]    Insurance: [employer]    PCP: [name], MD Referring: [name], MD    Appointment Facility: [facility]        * * *    11/14/2017   **Appointment Provider:** Herbert Deaner, [facility] **CHN#:** [id number]    --- ---      **Supervising Provider:** [name], MD    ---         **Reason for Appointment**    ---       1\. Status post right hand sympathectomy, 10/28/17.    ---       **History of Present Illness**    ---     _GENERAL_ :    Follow-up. The patient is now approximately 2-1/2 week's status post the above  procedure. She has a history of stable scleroderma. Overall, she is doing  well. She has not noted significant change in her symptoms. She has been  working with hand therapy both here at [facility] as well as close to home.       **Current Medications**    ---    Taking     * IVIg 800 mg infusion Oral 2x a month    ---    * Macrobid 100 MG Capsule 1 capsule with food Orally every 12 hrs    ---    * Vitamin B Complex - Tablet Orally     ---    * Vitamin C Tablet Oral     ---    * Vitamin D 2000 U 1 tab every day    ---    * WP Thyroid 32.5 MG Tablet 1 tablet on an empty stomach Orally Once a day    ---    * Medication List reviewed and reconciled with the patient    ---       **Past Medical History**    ---       Systemic sclerosis.        ---       **Surgical History**    ---       Hand surgery-left elbow- rt hand calcium excision 06/23/2017    ---    Radical resection, heterotopic bone and capsule, left elbow 07/24/2017    ---    Sympathectomy radial and ulnar artery 10/28/2017    ---       **Family History**    ---       : unknown    ---    Father: diagnosed with Diabetes    ---    No autoimmune.    ---       **Social History**    ---    Work/Occupation: full-time Consulting civil engineer.     Alcohol  Yes but rarely.    Marital Status  Single.    Tobacco  history: Never smoked.      **Allergies**    ---       Keflex: Allergy    ---    Cephalexin    ---    gandolinium    ---       **Hospitalization/Major Diagnostic Procedure**    ---       No Hospitalization  History.    ---       **Review of Systems**    ---     _ORT_ :    Eyes No. Ear, Nose Throat No. Digestion, Stomach, Bowel No. Bladder Problems  No. Bleeding Problems No. Numbness/Tingling No. Anxiety/Depression No.  Fever/Chills/Fatigue No. Chest Pain/Tightness/Palpitations No. Skin Rash No.  Dental Problems No. Joint/Muscle Pain/Cramps Yes. Blackout/Fainting No. Other  No.          **Vital Signs**    ---    Pain scale 0, Ht-in 5'5, Ht-cm 152.4.       **Examination**    ---     _General Examination_ :    On examination today, she is a well-appearing pleasant female in no apparent  distress. On examination of her right upper extremity, surgical incisions are  all healing well without erythema, drainage or evidence of infection. Sutures  are in place. She has limited wrist and digital range of motion. Palpable  pulses at the wrist.           **Assessments**    ---    1\. Scleroderma - M34.9 (Primary)    ---    2\. Raynaud''s phenomenon without gangrene - I73.00    ---       **Treatment**    ---       **1\. Others**    Notes: The patient is now just over 2 weeks status post the above procedure.  Few sutures on her finger were removed today; however, the remainder were left  in place. She did experience some vasovagal symptoms during suture removal.  She will speak with her primary care provider regarding either some pain  medication or antianxiety medication prior to her follow-up next week. At that  point, hopefully the remainder of her sutures will look ready to be removed.  In the meantime, she will continue with occupational therapy working on her  wrist and digital range of motion. She will keep her incisions clean and dry.  In addition, she  inquired about whether or not she may continue phototherapy  for wound healing. Dr. Rodman Pickle feels that she may continue with this and a  message was left for her.    ---      **Follow Up**    ---    1 Week (Reason: suture removal)    **Appointment Provider:** Herbert Deaner, [facility]    Electronically signed by [name] , MD on 11/30/2017 at 10:58 AM EDT    Sign off status: Completed        * * *        [facility]    [address]    [place], [address]    [place], [place] [address]    Tel: [phone]    Fax: [phone]              * * *          Patient: [name] DOB: Mar 05, 1986 Progress Note: Herbert Deaner, [facility]  11/14/2017    ---    Note generated by eClinicalWorks EMR/PM Software ([email or web address])

## 2020-06-27 NOTE — Progress Notes (Signed)
* * *        **  [name], [name]**    --- ---    59 Y old Female, DOB: 15-Oct-1985    [address], [place], [place] [address]    Home: [phone]    Provider: [name]        * * *    Telephone Encounter    ---    Answered by   Magdalene Patricia  Date: 01/13/2018         Time: 09:21 AM    Caller   Pt.    --- ---            Reason   Surgery Time            Message                      Good Morning,             Pt. stated that she has surgery tomorrow and she do not know the time of the surgery. Pt. can be reached at, [phone]  and is OK to LVM.             Thank you,                 Action Taken                      [facility]  01/13/2018 9:23:57 AM >       [name],[name]  01/13/2018 4:16:18 PM > Emailed her with information.                    * * *                ---          * * *          Patient: Samuelson, Briget DOB: 06/04/1985 Provider: [name] 01/13/2018    ---    Note generated by eClinicalWorks EMR/PM Software ([email or web address])

## 2020-06-27 NOTE — Progress Notes (Signed)
* * *        **[name], [name]**    --- ---    49 Y old Female, DOB: [birth year], External MRN: [id number]    Account Number: [id number]    [address], [name]    Home: [phone]    Insurance: [employer]    PCP: [name], MD Referring: [name], MD    Appointment Facility: [facility]        * * *    08/11/2017   **Appointment Provider:** Arlyss Repress, MD **CHN#:** [phone]    --- ---      **Supervising Provider:** [name], MD    ---         **Reason for Appointment**    ---       1\. f/u s/p radical resection of heterotopic bone left elbow, DOS 07/24/17    ---       **History of Present Illness**    ---     _GENERAL_ :    The patient is seen for evaluation s/p the above procedure. Pain has been  improving. No new paresthesias. No interval medical problems are noted. Here  for suture removal.       **Current Medications**    ---    Taking     * doxycycline 100MG      ---    * IVIg 800 mg infusion Oral 2x a month    ---    * Macrobid 100 MG Capsule 1 capsule with food Orally every 12 hrs    ---    * Vitamin B Complex - Tablet Orally     ---    * Vitamin C Tablet Oral     ---    * Vitamin D 2000 U 1 tab every day    ---    * WP Thyroid 32.5 MG Tablet 1 tablet on an empty stomach Orally Once a day    ---       **Past Medical History**    ---       Medical History Verified..        ---       **Surgical History**    ---       Hand surgery-left elbow- rt hand calcium excision    ---    Radical resection, heterotopic bone and capsule, left elbow 07/24/2017    ---       **Family History**    ---       : unknown    ---    No autoimmune.    ---       **Social History**    ---    Tobacco    history: _Never smoked_    Marital Status    _Single_    Work/Occupation: full-time Consulting civil engineer.    Alcohol    _Yes but rarely_       **Allergies**    ---       Keflex: Allergy    ---    Cephalexin    ---       **Hospitalization/Major Diagnostic Procedure**    ---       No  Hospitalization History.    ---       **Review of Systems**    ---     _ORT_ :    Eyes No. Ear, Nose Throat No. Digestion, Stomach, Bowel No. Bladder Problems  No. Bleeding Problems No. Numbness/Tingling No. Anxiety/Depression No.  Fever/Chills/Fatigue No.  Chest Pain/Tightness/Palpitations No. Skin Rash No.  Dental Problems No. Joint/Muscle Pain/Cramps Yes. Blackout/Fainting No. Other  No.          **Vital Signs**    ---    Pain scale 0, Ht-in 5'5, Ht-cm 152.4.       **Physical Examination**    ---    Well appearing and in no acute distress. On examination of the left upper  extremity, the posterior elbow incision Incision healing well with sutures in-  place.    Clean, dry, intact. No erythema or drainage.,    Elbow range of motion is much improved compared to preoperatively. Pronation  and supination are nearly symmetric to the contralateral side. Distally,  sensation is intact in the radial, median, and ulnar distributions. She fires  AIN, PIN, and ulnar motor groups. The hand is warm and well perfused with  brisk capillary refill.       **Assessments**    ---    1\. 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 Scleroderma - M34.9    ---    3\. 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 Heterotopic ossification**    Notes: The diagnosis and treatment plan was discussed with the patient at this  visit. All questions were answered to their apparent satisfaction and they  expressed an understanding and agreement with the plan. Her sutures were  removed, she will keep working with OT and use her night splint.    ---      **Follow Up**    ---    6 Weeks    **Appointment Provider:** Arlyss Repress, MD    Electronically signed by [name] , MD on 08/18/2017 at 08:08 AM EDT    Sign off status: Completed        * * *        [facility]    [address]    [place], [address]    [place], [place] [address]    Tel: [phone]    Fax: [phone]               * * *          Patient: [name], [name] DOB: 19-Nov-1985 Progress Note: Arlyss Repress, MD  08/11/2017    ---    Note generated by eClinicalWorks EMR/PM Software ([email or web address])

## 2020-06-27 NOTE — Progress Notes (Signed)
* * *        **[name], [name]**    --- ---    67 Y old Female, DOB: [birth year], External MRN: [id number]    Account Number: [id number]    [address], [name]    Home: [phone]    Insurance: [employer]    PCP: [name] Genta, MD Referring: [name] Genta, MD    Appointment Facility: [facility]        * * *    12/08/2017   **Appointment Provider:** [name] Manchester, MD **CHN#:** [phone]    --- ---      **Supervising Provider:** [name] Numbers, MD    ---         **Reason for Appointment**    ---       1\. FU RT HAND    ---       **History of Present Illness**    ---     _GENERAL_ :    35 year old woman with a history of scleroderma s/p right hand sympathectomy  on 10/28/17. She reports she is pleased with her results thus far. She has  noticed a difference in the pain and color of her hands during vasospasms. She  is potentially interested in a similar procedure on the left in the near  future.       **Current Medications**    ---    Taking     * Diazepam 5 mg Tablet 1 tablet as needed Orally PRN    ---    * Gabapentin 100 mg Capsule 1 capsule Orally Once a day PRN    ---    * IVIg 800 mg infusion Oral 2x a month    ---    * Vitamin B Complex - Tablet Orally     ---    * Vitamin C Tablet Oral     ---    * Vitamin D 2000 U 1 tab every day    ---    Not-Taking/PRN    * Macrobid 100 MG Capsule 1 capsule with food Orally every 12 hrs    ---    * WP Thyroid 32.5 MG Tablet 1 tablet on an empty stomach Orally Once a day    ---       **Past Medical History**    ---       Systemic sclerosis.        ---       **Surgical History**    ---       Hand surgery-left elbow- rt hand calcium excision 06/23/2017    ---    Radical resection, heterotopic bone and capsule, left elbow 07/24/2017    ---    Sympathectomy radial and ulnar artery 10/28/2017    ---       **Family History**    ---       : unknown    ---    Father: diagnosed with Diabetes    ---    No autoimmune.    ---        **Social History**    ---    Work/Occupation: full-time Consulting civil engineer.    Alcohol  Yes but rarely.    Marital Status  Single.    Tobacco  history: Never smoked.      **Allergies**    ---       Keflex: Allergy    ---    Cephalexin    ---  gandolinium    ---       **Hospitalization/Major Diagnostic Procedure**    ---       No Hospitalization History.    ---       **Review of Systems**    ---     _ORT_ :    Eyes No. Ear, Nose Throat No. Digestion, Stomach, Bowel No. Bladder Problems  No. Bleeding Problems No. Numbness/Tingling No. Anxiety/Depression No.  Fever/Chills/Fatigue No. Chest Pain/Tightness/Palpitations No. Skin Rash No.  Dental Problems No. Joint/Muscle Pain/Cramps Yes. Blackout/Fainting No. Other  No.          **Vital Signs**    ---    Pain scale 0, Ht-in 5'5, Ht-cm 152.4.       **Physical Examination**    ---    Right wrist: s/p sympathectomy. Incisions are well healed. Mild TTP over  incisions. Limited wrist and finger motion due to postoperative pain. Pulses:  Palpable right radial and ulnar at wrist. Deformity right middle finger with  hooked nail and flexed DIP. skin intact.       **Assessments**    ---    1\. Scleroderma - M34.9 (Primary)    ---    2\. Raynaud''s phenomenon without gangrene - I73.00    ---       **Treatment**    ---       **1\. Scleroderma**    Notes: Patient seen and evaluated with Dr [name].        [name] is doing very well post-op from her right hand sympathectomy and  nodule excisions. She is interested in a similar procedure for the left side,  but would like to discuss an alternative therapy involving adipocyte stem  cells. We would like some time to investigate this treatment, and we contact  her afterwards to progress the plan. All questions and concerns were  addressed.    ---    **Appointment Provider:** [name] Manchester, MD    Electronically signed by [name] , MD on 12/28/2017 at 07:03 PM EDT    Sign off status: Completed        * * *        [facility]    [address]    [place], [address]    [place], [place] [address]    Tel: [phone]    Fax: [phone]              * * *          Patient: [name], [name] DOB: 03/25/1986 Progress Note: [name] Manchester, MD  12/08/2017    ---    Note generated by eClinicalWorks EMR/PM Software ([email or web address])

## 2020-06-27 NOTE — Progress Notes (Signed)
* * *        **[name], [name]**    --- ---    35 Y old Female, DOB: April 04, 1985, External MRN: [id number]    Account Number: [id number]    [address], [name]    Home: [phone]    Insurance: [employer]    PCP: [name], MD Referring: [name], MD    Appointment Facility: [facility]        * * *    10/31/2017  Progress Notes: [name], MD **CHN#:** [phone]    --- ---    ---         **Reason for Appointment**    ---       1\. S/p right hand sympathectomy on 10/28/17    ---       **History of Present Illness**    ---     _GENERAL_ :    35 year old woman with a history of stable scleroderma s/p right hand  sympathectomy on 10/28/17. She is going well post op. She has had virtually no  pain since surgery. She reports no bleeding and only mild inflammation. She  has kept her hand bandaged, as indicated.       **Current Medications**    ---    Taking     * doxycycline 100MG      ---    * IVIg 800 mg infusion Oral 2x a month    ---    * Macrobid 100 MG Capsule 1 capsule with food Orally every 12 hrs    ---    * Vitamin B Complex - Tablet Orally     ---    * Vitamin C Tablet Oral     ---    * Vitamin D 2000 U 1 tab every day    ---    * WP Thyroid 32.5 MG Tablet 1 tablet on an empty stomach Orally Once a day    ---       **Past Medical History**    ---       Systemic sclerosis.        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    ---       **Social History**    ---    Work/Occupation: full-time Consulting civil engineer.    Alcohol  Yes but rarely.    Marital Status  Single.    Tobacco  history: Never smoked.      **Allergies**    ---       Keflex: Allergy    ---    Cephalexin    ---    gandolinium    ---        **Hospitalization/Major Diagnostic Procedure**    ---       No Hospitalization History.    ---       **Review of Systems**    ---     _ORT_ :    Eyes No. Ear, Nose Throat No. Digestion, Stomach,  Bowel No. Bladder Problems  No. Bleeding Problems No. Numbness/Tingling No. Anxiety/Depression No.  Fever/Chills/Fatigue No. Chest Pain/Tightness/Palpitations No. Skin Rash No.  Dental Problems No. Joint/Muscle Pain/Cramps Yes. Blackout/Fainting No. Other  No.          **Vital Signs**    ---    Pain scale 0, Ht-in 5'5, Ht-cm 152.4.       **Physical Examination**    ---    Right wrist: s/p sympathectomy. Incisions look clean, dry and intact. Mild TTP  over incisions. Limited wrist and finger motion due to postoperative pain.  Pulses: Palpable right radial and ulnar at wrist. Deformity right middle  finger with hooked nail and flexed DIP. skin intact.       **Assessments**    ---    1\. Scleroderma - M34.9 (Primary)    ---    2\. Raynaud''s phenomenon without gangrene - I73.00    ---       **Treatment**    ---       **1\. Scleroderma**    Notes: 35 yo female with a history of scleroderma presenting for a  postoperative visit after wrist right sympathectomy on 10/28/17. She is doing  great and has no pain. Her dressing was changed today. She was advised to  continue wound care and avoid submerging the wounds until the sutures are  removed. We will plan to remove stitches in 3 weeks. She can begin OT and move  the wrist and fingers as tolerated. Patient was seen and evaluated with Dr.  Rodman Pickle. All questions were answered.    ---      **Follow Up**    ---    3 Weeks    Electronically signed by [name] , MD on 11/02/2017 at 11:11 AM EDT    Sign off status: Completed        * * *        [facility]    [address]    [place], [address]    [place], [place] [address]    Tel: [phone]    Fax: [phone]              * * *          Patient: [name], [name] DOB: [birth year] Progress Note: [name],  MD  10/31/2017    ---    Note generated by eClinicalWorks EMR/PM Software ([email or web address])

## 2020-06-27 NOTE — Progress Notes (Signed)
* * *        **Kenna, Chane**    --- ---    68 Y old Female, DOB: [birth year], External MRN: [id number]    Account Number: [id number]    [address] [name]    Home: [phone]    Insurance: [employer]    PCP: [name], MD Referring: [name], MD    Appointment Facility: [facility]        * * *    08/01/2017   **Appointment Provider:** Tilton Marsalis **CHN#:** [id number]    --- ---      **Supervising Provider:** [name], MD    ---         **Reason for Appointment**    ---       1\. f/u s/p radical resection of heterotopic bone left elbow, DOS 07/24/17    ---       **History of Present Illness**    ---     _GENERAL_ :    Elfrieda presents today for her first followup visit status post radical resection  of heterotopic bone from the left elbow. She reports that she has been doing  very well. She saw occupational therapy for thermoplast splint fabrication.  She does report the development of a rash in the area of some Tegaderm  following splint removal, but otherwise denies any issues with her incision.  She denies any drainage from her incision. She denies any numbness or tingling  in the extremity. Denies any fevers or chills. Her pain is overall very well  controlled and she is not taking any medications for pain at this point. She  is very happy with her range of motion and already feels it is much improved  compared to preoperatively.       **Current Medications**    ---    Taking     * doxycycline 100MG      ---    * IVIg 800 mg infusion Oral 2x a month    ---    * Macrobid 100 MG Capsule 1 capsule with food Orally every 12 hrs    ---    * Vitamin B Complex - Tablet Orally     ---    * Vitamin C Tablet Oral     ---    * Vitamin D 2000 U 1 tab every day    ---    * WP Thyroid 32.5 MG Tablet 1 tablet on an empty stomach Orally Once a day    ---       **Past Medical History**    ---       Medical History Verified..        ---       **Surgical History**     ---       Hand surgery-left elbow- rt hand calcium excision    ---    Radical resection, heterotopic bone and capsule, left elbow 07/24/2017    ---       **Family History**    ---       : unknown    ---    No autoimmune.    ---       **Social History**    ---    Tobacco  history: Never smoked.    Marital Status  Single.    Work/Occupation: full-time Consulting civil engineer.    Alcohol  Yes but rarely.      **Allergies**    ---  Keflex: Allergy    ---    Cephalexin    ---       **Hospitalization/Major Diagnostic Procedure**    ---       No Hospitalization History.    ---       **Review of Systems**    ---     _ORT_ :    Eyes No. Ear, Nose Throat No. Digestion, Stomach, Bowel No. Bladder Problems  No. Bleeding Problems No. Numbness/Tingling No. Anxiety/Depression No.  Fever/Chills/Fatigue No. Chest Pain/Tightness/Palpitations No. Skin Rash No.  Dental Problems No. Joint/Muscle Pain/Cramps Yes. Blackout/Fainting No. Other  No.          **Vital Signs**    ---    Pain scale 5, Ht-in 5'5, Ht-cm 152.4.       **Physical Examination**    ---    Well appearing and in no acute distress. On examination of the left upper  extremity, the posterior elbow incision is healing well without any  surrounding erythema or drainage. Nylon sutures remain in place. Elbow range  of motion is much improved compared to preoperatively. She has a range of  motion arc from approximately 20 degrees to 110 degrees. Pronation and  supination are nearly symmetric to the contralateral side. Distally, sensation  is intact in the radial, median, and ulnar distributions. She fires AIN, PIN,  and ulnar motor groups. The hand is warm and well perfused with brisk  capillary refill.    IMAGING: X-rays of the left elbow were obtained and reviewed today. These  demonstrate marked decrease in the presence of heterotopic bone in comparison  to prior studies. There are no fractures or dislocations appreciated.       **Assessments**    ---    1\. 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 Scleroderma - M34.9    ---    3\. 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 Others**    Notes: The patient was seen and evaluated with Dr. Rodman Pickle. Overall, Ms.  Spraggins is doing very well now approximately 1 week out from radical resection  of heterotopic bone from the left elbow. She may continue with activity and  range of motion as tolerated in regards to the left elbow; however, she should  continue to avoid any heavy lifting. She should also continue to keep her  incision clean and dry. She should continue working with occupational therapy  and with her nighttime brace wear. We will plan to see her back in  approximately 1 week for suture removal. All questions were answered and Anaeli  was in full agreement with the plan.    ---      **Follow Up**    ---    1 Week    **Appointment Provider:** Dajana Gehrig    Electronically signed by [name] , MD on 08/18/2017 at 08:08 AM EDT    Sign off status: Completed        * * *        [facility]    [address]    [place], [address]    [place], [place] [address]    Tel: [phone]    Fax: [phone]              * * *  Patient: JURI, DINNING DOB: Apr 11, 1985 Progress Note: Aivah Putman  08/01/2017    ---    Note generated by eClinicalWorks EMR/PM Software ([email or web address])

## 2020-06-27 NOTE — Progress Notes (Signed)
* * *        **  [name], [name]**    --- ---    53 Y old Female, DOB: [birth year]    [address], [place], [place] [address]    Home: [phone]    Provider: [name]        * * *    Telephone Encounter    ---    Answered by   Charyl Dancer  Date: 11/07/2017         Time: 09:38 AM    Caller   Tarri    --- ---            Reason   PT SCRIPT            Message                      Hi,      Patient is requesting PT script for her right hand faxed to [facility] at [phone] and [facility] at [phone]. She said its urgent and they wont see her without a script.       Thank you                 Action Taken                      Frimpong,Eric  11/07/2017 9:43:49 AM >       [name],[name]  11/07/2017 9:52:54 AM > FAXED                    * * *                ---          * * *          Patient: [name], [name] DOB: 1985/06/27 Provider: [name] 11/07/2017    ---    Note generated by eClinicalWorks EMR/PM Software ([email or web address])

## 2020-06-27 NOTE — Progress Notes (Signed)
* * *        **[name], [name]**    --- ---    83 Y old Female, DOB: 12-26-1985, External MRN: [id number]    Account Number: [id number]    [address] [id number]    Home: [phone]    Insurance: [facility] OUT IPA HMO    PCP: Talbert Forest, MD Referring: Talbert Forest, MD    Appointment Facility: [facility]        * * *    06/04/2017  Progress Notes: [name], MD **CHN#:** [phone]    --- ---    ---         **Reason for Appointment**    ---       1\. Left elbow contracture, left hand cold    ---       **History of Present Illness**    ---     _GENERAL_ :    follow-up. she postoponed her surgery. the elbow wound is smaller and has not  gotten infected, but continues to drain a bit. She would like to get the wound  to heal and to improve her extnesion. she also has had episodes of fingers  becoming white and cold intermittently. She saw Dr. Garnette Czech at the [facility] and  was apparently told that there was nothing wrong.       **Current Medications**    ---    Taking     * IVIg 800 mg infusion Oral 2x a month    ---    * Vitamin B Complex - Tablet Orally     ---    * Vitamin C Tablet Oral     ---    * Vitamin D 2000 U 1 tab every day    ---    * WP Thyroid 32.5 MG Tablet 1 tablet on an empty stomach Orally Once a day    ---    * Medication List reviewed and reconciled with the patient    ---       **Past Medical History**    ---       Medical History Verified..        ---       **Surgical History**    ---       Hand surgery-left elbow- rt hand calcium excision    ---      **Family History**    ---       : unknown    ---    No autoimmune.    ---       **Social History**    ---    Tobacco  history: Never smoked.    Marital Status  Single.    Work/Occupation: full-time Consulting civil engineer.    Alcohol  Yes but rarely.      **Allergies**    ---       Keflex: Allergy    ---    Cephalexin    ---       **Hospitalization/Major Diagnostic Procedure**    ---       No Hospitalization History.    ---        **Review of Systems**    ---     _ORT_ :    Eyes No. Ear, Nose Throat No. Digestion, Stomach, Bowel No. Bladder Problems  No. Bleeding Problems No. Numbness/Tingling No. Anxiety/Depression No.  Fever/Chills/Fatigue No. Chest Pain/Tightness/Palpitations No. Skin Rash No.  Dental Problems No. Joint/Muscle Pain/Cramps Yes. Blackout/Fainting No. Other  No.          **Vital Signs**    ---    Pain scale 5, Ht-in 5'5, Ht-cm 152.4.       **Physical Examination**    ---    she appears healthy. examination of her left elbow demonstrates a 2 cm linear  wound with a white base along the old posterolateral scar. no erythema.  AROM=PROM -30/140, full rotation. the cubital tunnel is somewhat obliterated.  sensation intact to light touch.    somewhat delayed capillary refill in digits. Doppler unavailable. radial pulse  palpable. digital motion limited. skin intact. CT of left elbow reviewed by me  demonstrates extensive calcium deposits around posterior aspect of elbow not  extneding anteriorly. also separate deposit along ulna more distally at site  of nodule. some obiteration of cubital tunnel.       **Assessments**    ---    1\. 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 Scleroderma - M34.9    ---    3\. Raynaud''s phenomenon without gangrene - I73.00    ---       **Treatment**    ---       **1\. Contracture of left elbow**    Notes: nature of left elbow problem reviewed with her. I do not think that  wound will heal without removal of the calcium deposits, which are extensive.  I explained that I would favor radical resection of the calcium via open  posterior approach, which will lallow for closure of the wound. I explained  that i would not like to do an anterior capsulotomy at the same time given the  wound and potential infection risks. an arthroscopic release could be done at  a later date if necessary. With respect to her hand, she has Raynaud's which  is very common in scleroderma. An MRA will be  obtained once she is fully  recovered from the elbow. RIsks of surgery, including but not limited to  infeciton,nerve damage, stiffness, persistent or recurrent wound, incomplete  correction of contracture, persistent or recurrent calcium depostis explained,  she wishes to proceed. 23 hour observation, splint and drain, initiate therapy  on POD1.    ---      **Follow Up**    ---    surgery    Electronically signed by [name] , MD on 06/07/2017 at 08:56 AM EST    Sign off status: Completed        * * *        [facility]    [address]    [place], [address]    [place], [place] [address]    Tel: [phone]    Fax: [phone]              * * *          Patient: [name], [name] DOB: [birth year] Progress Note: [name], MD  06/04/2017    ---    Note generated by eClinicalWorks EMR/PM Software ([email or web address])

## 2020-06-30 HISTORY — PX: OTHER SURGICAL HISTORY: SHX170

## 2020-07-08 ENCOUNTER — Encounter (HOSPITAL_BASED_OUTPATIENT_CLINIC_OR_DEPARTMENT_OTHER): Admitting: Hand Surgery

## 2020-07-16 ENCOUNTER — Encounter

## 2020-07-24 ENCOUNTER — Encounter (HOSPITAL_BASED_OUTPATIENT_CLINIC_OR_DEPARTMENT_OTHER): Admitting: Hand Surgery

## 2020-07-26 NOTE — Anesthesia Pre-Procedure Evaluation (Addendum)
Patient: [name]    Procedure Information     Date/Time: 07/27/20 1130    Procedure: Excision, Bone Spur (Left Middle Finger)    Location: [facility] OR 21 / [facility] Operating Room    Surgeons: [name], MD      35 year old female with PMH: Hypothyroid, IBS, Raynauds, and systemic sclerosis (Scleroderma) with weekly subcutaneous IVIG treatment. She has previously undergone calcinosis excisions involving her left elbow, bilateral wrists, and multiple digits on the bilateral hands as well as bilateral radial/ulnar artery sympathectomies. Pre-ops for excision of left middle finger bone spur.    *01/28/19 MAC with propofol infusion and Precedex bolus doses, tolerated well, difficult IV placement.     *01/14/2018: Sympathectomies Wrist/hand- General with LMA 4.     *10/28/2017: Sympathectomies, excision of bone cyst. General, Easy mask ventilation, McGrath-grade 1 view, 7.0 ETT.    Relevant Problems   Anesthesia   (+) PONV (postoperative nausea and vomiting) (Scopolamine patch has helped in past)      Cardio (within normal limits)      Pulmonary (within normal limits)      Endo   (+) Hypothyroidism due to Hashimoto's thyroiditis      GI   (+) PONV (postoperative nausea and vomiting) (Scopolamine patch has helped in past)      GU/Renal (within normal limits)      Neuro/Psych (within normal limits)      Circulatory   (+) Difficult intravenous access   (+) Raynaud's disease      NICU Digestive   (+) IBS (irritable bowel syndrome)      Other   (+) Scleroderma (CMS/HCC)     She wants as little sedation as possible. She expresses concern over the burning with IV propofol. I assured her if we use it we will give it slowly and use IV lidocaine first to prevent the burning.  Clinical information reviewed:    Allergies  Meds                  Physical Exam    Airway  Mallampati: II  TM distance: >3 FB  Mouth opening: >3 FB  Neck ROM: full     Cardiovascular - normal exam     Dental - normal exam     Pulmonary - normal exam      Abdominal    General   Alert                 Anesthesia Plan    ASA 3   NPO status verified    MAC     Airway: natural airway  Monitoring: standard monitors  Postoperative Pain Control: IV/PO analgesics      Anesthetic plan and risks discussed with patient.         Additional Equipment Requests

## 2020-07-26 NOTE — H&P (Signed)
History and Physical    Patient Name: [name]  MRN: [id number]    History of Present Illness:  35 year old female with systemic sclerosis presents for evaluation of bilateral hand involvement. She was last seen in office in April 2021. She has previously under gone calcinosis excisions involving her left elbow, bilateral wrists, and multiple digits on the bilateral hands as well as bilateral hands sympathectomies. She presents today specifically to discuss calcinosis to the middle fingertip of her left hand in addition to irritation over the ulnar aspect of her right index finger PIP joint, and bilateral small finger PIP contractures. She reports her perfusion has been good to all fingers. Of note she is very happy about she was able to swim in cold water this winter without issues. In terms of her left middle finger tip calcinosis she reports that for 5 months ago this area opened and is now draining milky/chalky appearing fluid on a daily basis.      Allergies:  Allergies   Allergen Reactions   ? Cephalexin Shortness of breath and Nausea And Vomiting     NAUSEA  NAUSEA  Tolerated Cefazolin on 03/09/18 for GYN procedure    NAUSEA  Other reaction(s): GI Intolerance  Rxn: Vomiting  NAUSEA  NAUSEA    NAUSEA  NAUSEA  NAUSEA  Other reaction(s): GI Intolerance  Rxn: Vomiting  NAUSEA  NAUSEA  NAUSEA  NAUSEA  NAUSEA  Other reaction(s): GI Intolerance  Rxn: Vomiting  NAUSEA  NAUSEA  Rxn: Vomiting  NAUSEA     ? Ciprofloxacin Anaphylaxis, Rash and Shortness of breath     Other reaction(s): Headache  Other reaction(s): Headache  Other reaction(s): Headache  Other reaction(s): Headache  Other reaction(s): Headache   ? Dextroamphetamine-Amphetamine Swelling     Patient reports swelling of gums. Symptom dissipated without treatment. As of 6/1 pt reports she is not allergic to Adderrall.  Patient reports swelling of gums. Symptom dissipated without treatment. As of 6/1 pt reports she is not allergic to Adderrall.  Patient reports  swelling of gums. Symptom dissipated without treatment. As of 6/1 pt reports she is not allergic to Adderrall.  Patient reports swelling of gums. Symptom dissipated without treatment. As of 6/1 pt reports she is not allergic to Adderrall.  Patient reports swelling of gums. Symptom dissipated without treatment. As of 6/1 pt reports she is not allergic to Adderrall.  Patient reports swelling of gums. Symptom dissipated without treatment. As of 6/1 pt reports she is not allergic to Adderrall.  Patient reports swelling of gums. Symptom dissipated without treatment. As of 6/1 pt reports she is not allergic to Adderrall.  Patient reports swelling of gums. Symptom dissipated without treatment. As of 6/1 pt reports she is not allergic to Adderrall.   ? Doxycycline Rash and Shortness of breath     Other reaction(s): Difficulty breathing   ? Prednisone Rash   ? Sodium Swelling     Anything with sodium       Medical History:  Past Medical History:   Diagnosis Date   ? Hypothyroidism    ? PTSD (post-traumatic stress disorder)        Surgical History:  Past Surgical History:   Procedure Laterality Date   ? OTHER SURGICAL HISTORY      bone spurs   ? RHINOPLASTY         Current Medications:  No current facility-administered medications for this encounter.     Current Outpatient Medications   Medication Sig Dispense Refill   ?  IMMUN GLOB G-SORB-GLY-IGA 0-50 IV Infuse into a venous catheter. Weekly infusion     ? NON FORMULARY Compounded thyroid medication         Social history:  Tobacco Use: Low Risk    ? Smoking Tobacco Use: Never Smoker   ? Smokeless Tobacco Use: Never Used     Alcohol Use: Not on file       Review of Systems  Negative    Physical Exam  Gen: well-appearing, in NAD, A&O  The right index finger PIP joint has a small raised areas over the ulnar aspect that is tender and red, feels like a calcified nodule, diminished sensation just distal to this area but intact throughout the remainder of the procedure. Left middle  finger tip has a palpable nodule under the skin, with opening of the skin and a small amount of chalky calcific material deep to this. There are 90 degree contractures of the small finger PIP joints bilaterally with approximately 10 degree arc of motion at these joints. She has well maintained motion at the small finger MCP and DIP joints.        Assessment/Plan:  Patient was seen and examined with Dr. Rodman Pickle. She is a 35 year old female with systemic sclerosis here to discuss multiple issues today. Primarily she has left middle finger tip calcinosis that has eroded the skin is now draining calcific material on a daily basis. She also has irritation over the ulnar aspect of her right index finger PIP joint, likely related to recurrent calcific deposit. Finally she has bilateral small finger PIP contractures. We recommend first addressing the left middle finger calcinosis surgically which would include excision. In the same setting we could address her small finger PIP contracture on the left. We discussed possible surgical options which would include a PIP fusion versus a digit widget. We discussed that fusion would result in her PIP joint being fixed at a specific degree of flexion. In contrast the digit widget would ideally help her regain motion at the PIP joint, however it requires more extensive involvement including bone pins through the skin that would remain in place for 6 weeks. She would have to keep the sites clean and dry. She would like to try the digit widget for the left small finger PIP contracture. We discussed all risks of surgery including bleeding, infection, damage to structures, pain, fracture, pin tract infection, pin site complications, wound complications, recurrent contracture, stiffness. She understands and wishes to proceed with surgery, we will schedule her for left middle finger excision of calcinosis and left small finger PIP digit widget. She will follow up with [place] postoperatively.        Avis Epley, MD

## 2020-07-27 ENCOUNTER — Encounter

## 2020-07-27 ENCOUNTER — Other Ambulatory Visit

## 2020-07-27 ENCOUNTER — Encounter (HOSPITAL_BASED_OUTPATIENT_CLINIC_OR_DEPARTMENT_OTHER): Admission: RE | Disposition: A | Discharge status: Home / Self Care | Source: Ambulatory Visit | Attending: Hand Surgery

## 2020-07-27 ENCOUNTER — Encounter (HOSPITAL_BASED_OUTPATIENT_CLINIC_OR_DEPARTMENT_OTHER): Admitting: Hand Surgery

## 2020-07-27 ENCOUNTER — Ambulatory Visit
Admission: RE | Admit: 2020-07-27 | Discharge: 2020-07-27 | Disposition: A | Discharge status: Home / Self Care | Source: Ambulatory Visit | Attending: Hand Surgery | Admitting: Hand Surgery

## 2020-07-27 ENCOUNTER — Ambulatory Visit (HOSPITAL_BASED_OUTPATIENT_CLINIC_OR_DEPARTMENT_OTHER)

## 2020-07-27 LAB — HCG, URINE, QUALITATIVE: HCG Qualitative, Urine: NEGATIVE

## 2020-07-27 SURGERY — Excision, Bone Spur
Anesthesia: Monitor Anesthesia Care | Site: Middle Finger | Laterality: Left | Wound class: Class I/ Clean

## 2020-07-27 MED ORDER — oxyCODONE (Roxicodone) 5 mg immediate release tablet
5 | ORAL_TABLET | ORAL | 0 refills | 8.00000 days | Status: AC | PRN
Start: 2020-07-27 — End: 2020-08-03
  Filled 2020-07-27: qty 10, 2d supply, fill #0

## 2020-07-27 MED ORDER — ondansetron (Zofran) injection 4 mg
4 | Freq: Once | INTRAMUSCULAR | Status: DC
Start: 2020-07-27 — End: 2020-07-27

## 2020-07-27 MED ORDER — vancomycin (Vancocin) 850 mg in sodium chloride 0.9 % 250 mL IVPB
0.9 | Freq: Once | INTRAVENOUS | Status: DC
Start: 2020-07-27 — End: 2020-07-27

## 2020-07-27 MED ORDER — midazolam (Versed) injection
1 | INTRAMUSCULAR | Status: DC | PRN
Start: 2020-07-27 — End: 2020-07-27
  Administered 2020-07-27: 17:00:00 1 via INTRAVENOUS
  Administered 2020-07-27: 16:00:00 2 via INTRAVENOUS
  Administered 2020-07-27: 16:00:00 1 via INTRAVENOUS

## 2020-07-27 MED ORDER — midazolam (Versed) 1 mg/mL injection  - Omnicell Override Pull
1 | INTRAMUSCULAR | Status: AC
Start: 2020-07-27 — End: ?

## 2020-07-27 MED ORDER — sodium chloride 0.9 % flush 10 mL
Freq: Three times a day (TID) | INTRAMUSCULAR | Status: DC | PRN
Start: 2020-07-27 — End: 2020-07-27

## 2020-07-27 MED ORDER — lactated Ringer's infusion
INTRAVENOUS | Status: DC | PRN
Start: 2020-07-27 — End: 2020-07-27
  Administered 2020-07-27: 16:00:00 via INTRAVENOUS

## 2020-07-27 MED ORDER — ceFAZolin (Ancef) 1 gram injection  - Omnicell Override Pull
1 | INTRAMUSCULAR | Status: AC
Start: 2020-07-27 — End: ?

## 2020-07-27 MED ORDER — mepivacaine PF (Carbocaine) 20 mg/mL (2 %) injection
20 | INTRAMUSCULAR | Status: DC | PRN
Start: 2020-07-27 — End: 2020-07-27
  Administered 2020-07-27: 17:00:00 14 via INTRAMUSCULAR

## 2020-07-27 MED ORDER — acetaminophen (Tylenol) tablet 650 mg
325 | Freq: Four times a day (QID) | ORAL | Status: DC | PRN
Start: 2020-07-27 — End: 2020-07-27

## 2020-07-27 MED ORDER — ceFAZolin (Ancef) injection
1 | INTRAMUSCULAR | Status: DC | PRN
Start: 2020-07-27 — End: 2020-07-27
  Administered 2020-07-27: 16:00:00 1 via INTRAVENOUS

## 2020-07-27 MED ORDER — gabapentin (Neurontin) 100 mg capsule
100 | ORAL_CAPSULE | Freq: Three times a day (TID) | ORAL | 0 refills | Status: AC
Start: 2020-07-27 — End: 2021-07-27
  Filled 2020-07-27: qty 21, 7d supply, fill #0

## 2020-07-27 MED ORDER — oxyCODONE (Roxicodone) immediate release tablet 5 mg
5 | ORAL | Status: DC | PRN
Start: 2020-07-27 — End: 2020-07-27

## 2020-07-27 MED ORDER — fentaNYL (Sublimaze) 50 mcg/mL injection  - Omnicell Override Pull
50 | INTRAMUSCULAR | Status: AC
Start: 2020-07-27 — End: ?

## 2020-07-27 MED ORDER — ibuprofen tablet 600 mg
600 | Freq: Four times a day (QID) | ORAL | Status: DC | PRN
Start: 2020-07-27 — End: 2020-07-27

## 2020-07-27 MED FILL — FENTANYL (PF) 50 MCG/ML INJECTION SOLUTION: 50 50 mcg/mL | INTRAMUSCULAR | Qty: 2

## 2020-07-27 MED FILL — CEFAZOLIN 1 GRAM SOLUTION FOR INJECTION: 1 1 gram | INTRAMUSCULAR | Qty: 1000

## 2020-07-27 MED FILL — MIDAZOLAM 1 MG/ML INJECTION SOLUTION WRAPPER: 1 1 mg/mL | INTRAMUSCULAR | Qty: 2

## 2020-07-27 SURGICAL SUPPLY — 20 items
APPLICATOR CHLORAPREP 10.5ML O (Prep) ×1
APPLICATOR CHLOROPREP 26ML HI (Prep) ×1
BANDAGE KLING 3X5 ST (General Wound Care) ×1
CUFF TOURNIQUET 2PORT 18X4 RED (Pt Monitoring Supply) ×1
DRAPE MINI C ARM 54X85 W/ELAST (Drape) ×1
DRESSING GAUZE 2X2 50PK/TY 8PL (Dressing) ×2
DRESSING GAUZE 4X4 12PLY 10/PK (Dressing) ×1
DRESSING XEROFORM 1X8 ST (Dressing) ×1
GLOVE POLYISOPRENE SZ7.5 ST PF (Glove) ×2
GLOVE SURG BIOGEL SZ7.5 LF PF (Glove) ×1
MANIFOLD 4 PORT NEPTUNE2 (Surgical Supply General) ×1
NO LONGER USED USE ITEM 959826 (Medical Supply General) ×1
PACK EXTREMITY/PODIATRY CUSTOM (Custom Pack) ×1
PAD UNDERCAST WYTEX 3X4YD ST (Softgoods Bracing/Support) ×1
POSITIONER DONUT HEADREST 9IN (Surgical Supply General) ×1
SLEEVE SCD COMFORT KNEE MED NS (Medical Supply General) ×1
SOLUTION 0.9% NACL IRR 1000ML (Solution) ×1
STOCKINETTE 2PLY 4X48IN ST (Medical Supply General) ×1
SUTURE ETHILON 5 0 18IN PS2 (Suture) ×1
SYSTEM AGEE DIGIT WIDGET (Orthopedic Implant) ×1 IMPLANT

## 2020-07-27 NOTE — Anesthesia Post-Procedure Evaluation (Signed)
Patient: [name]    Procedure Summary     Date: 07/27/20 Room / Location: [facility] OR 20 / [facility] Operating [facility]    Anesthesia Start: 1210 Anesthesia Stop: 1316    Procedure: excision calcium deposit (Left Middle Finger) Diagnosis: (LT MIDDLE FINGER CALCINOSIS, SMALL FINGER CONTRACTURE)    Surgeons: [name], MD Responsible Provider: Lesia Sago, MD    Anesthesia Type: MAC ASA Status: 3          Anesthesia Type: MAC    Vitals Value Taken Time   BP 115/59 07/27/20 1316   Temp 36.5 07/27/20 1316   Pulse 85 07/27/20 1316   Resp 18 07/27/20 1316   SpO2 99 % 07/27/20 1316   Vitals shown include unvalidated device data.    Anesthesia Post Evaluation Note:    Patient location during evaluation: PACU  Patient participation: able to participate  Level of consciousness: awake  Cardiovascular and Hydration status: vital signs within acceptable range  Respiratory Status Stable and Airway Patent: yes  Nausea and Vomiting Control Satisfactory: yes  Pain score: 0  Pain management: adequate  Multimodal analgesia pain management approach     Aldrete score reviewed: no  Vitals reviewed: yes  Unplanned ICU Admission: noPatient has recovered from anesthesia and has returned to baseline mental status, cardiovascular and respiratory function. Pain, nausea, and vomiting are adequately controlled and the patient is adequately hydrated and appropriate for discharge from PACU?: yes      No complications documented.

## 2020-07-27 NOTE — Brief Op Note (Signed)
Date: 07/27/2020  Location: [facility] OR    Name: [name], DOB: 31-Aug-1985, MRN: [id number]    Diagnosis  * No Diagnosis Codes entered * * No Diagnosis Codes entered *     Procedures  excision calcium deposit  25135 - PR EXCIS BENIGN LESN CARPALS,AUTOGRFT    PR APPLY BONE Oscar La DEV [[id number]]  Surgeons      * [name] - Primary    Procedure Summary  Anesthesia: Monitor Anesthesia Care  ASA: III  Estimated Blood Loss: Minimal  Drains: * None in log *    Implants     Type Name Action Serial No.      Orthopedic Implant SYSTEM Fritz Pickerel - [id number] - [id number] Implanted [id number]         Staff:   Circulator: Baxter Flattery, RN; Jess Barters, RN  Scrub Person: Cordella Register, RN; NESVACIL, MALLORY    Indications: [name] is an 35 y.o. female who is having surgery for LT MIDDLE FINGER CALCINOSIS, SMALL FINGER CONTRACTURE.      Findings: see dictation    Complications:  None; patient tolerated the procedure well.     Disposition: PACU - hemodynamically stable.  Condition: stable  Specimens Collected:   Order Name Source Comment Collection Info Order Time   HCG, URINE, QUALITATIVE Urine  Collected By: Laretta Bolster, RN 07/27/2020 10:54 AM

## 2020-07-27 NOTE — Op Note (Signed)
excision calcium deposit (L) Operative Note     Date: 07/27/2020  Location: [facility] OR    Name: [name], DOB: 1985/12/16, MRN: [id number]    Diagnosis  * scleroderma  Calcinosis left middle finger  Left small finger contracture * * No Diagnosis Codes entered *     Procedures  excision calcium deposit  25135 - PR EXCIS BENIGN LESN CARPALS,AUTOGRFT    PR APPLY BONE Oscar La DEV [[id number]]  Surgeons      * [name] - Primary    Procedure Summary  Anesthesia: Monitor Anesthesia Care  ASA: III  Estimated Blood Loss: None  Total IV Fluids: see anesthesiology notes mL  Drains: * None in log *    Implants     Type Name Action Serial No.      Orthopedic Implant SYSTEM Fritz Pickerel - [id number] - [id number] Implanted [id number]         Staff:   Circulator: Baxter Flattery, RN; Jess Barters, RN  Scrub Person: Cordella Register, RN; NESVACIL, MALLORY    Indications: [name] is an 35 y.o. female who is having surgery for LT MIDDLE FINGER CALCINOSIS, SMALL FINGER CONTRACTURE.  She has severe scleroderma and has had multiple procedures on her hands.  She has developed calcinosis in the pulp of her left middle finger, causing pain.  In addition, she has a severe contracture of her left small finger with impending tissue loss dorsally.  We discussed the option of arthrodesis and she declined.  She would like to consider digit widget external fixation for gradual correction understanding that the contracture may recur.  Other risks including not limited to infection nerve damage stiffness incomplete correction tissue loss were explained.       Procedure Details:  The patient was seen in the preoperative area. The risks, benefits, complications, treatment options, non-operative alternatives, expected recovery and outcomes were discussed with the patient. The possibilities of reaction to medication, pulmonary aspiration, injury to surrounding structures, bleeding, recurrent infection, the need for additional  procedures, failure to diagnose a condition, and creating a complication requiring transfusion or operation were discussed with the patient. The patient concurred with the proposed plan, giving informed consent.? The site of surgery was properly noted/marked if necessary per policy. The patient has been actively warmed in preoperative area. Preoperative antibiotics have been ordered and given within 1 hours of incision. Venous thrombosis prophylaxis have been ordered including bilateral sequential compression devices     Following adequate anesthesia, the patient placed in the supine position on the operating table.  Left upper extremity is prepped and draped in sterile fashion.  Webspace blocks were performed at the left left middle and small finger using 2% mepivacaine.  The digit widget system was used.  The drill guide was placed over the middle phalanx level of the left small finger.  Proper placement was confirmed fluoroscopically.  The 2 drill bits were placed under fluoroscopic guidance, and exchanged with threaded half pins.  Proper pin placement was confirmed fluoroscopically.  The pins were trimmed and the guide removed.  The clamp was secured.    Next, the limb was exsanguinated using an Esmarch and a proximal arm tourniquet elevated to 230 mmHg.  A longitudinal incision was made at the pulp of the left middle finger.  The skin was carefully elevated from the area of calcinosis.  The calcinosis was debrided using a combination of curette and rongeur.  This involved the volar surface of the distal phalanx as well as  the distal tuft volar to the nail bed.  Fluoroscopy confirmed that all of the calcinosis had been removed.  The tourniquet is released.  Hemostasis was achieved with pressure followed by bipolar cautery.  The wound was irrigated copiously and the skin approximated with 5-0 nylon interrupted sutures.  Sterile dressings were applied.    The outrigger was then secured to the digit widget clamp.   A neoprene sleeve was placed, and an medium weight rubber band was applied.  Patient tolerated the procedure well was discharged to recovery room in good condition.    Findings: calcinosis left middle finger    Complications:  None; patient tolerated the procedure well.     Disposition: PACU - hemodynamically stable.  Condition: stable    [name]  Phone Number: [phone]

## 2020-07-27 NOTE — Op Note (Signed)
Pt takes Dessicated Pork Thyroid medication

## 2020-07-27 NOTE — Discharge Instructions (Signed)
Please keep dressing clean dry and intact until follow up  Please work on range of motion with the digit widget  Elevate to reduce swelling  Bruising and swelling are normal    Take motrin 600mg  and tylenol 650 every 6 hours as needed for pain  Oxycodone and gabapentin can be used as needed    Follow up with Dr. Rodman Pickle on 07/31/20. We will call you with the exact time of the appointment. Thank you.

## 2020-07-31 ENCOUNTER — Ambulatory Visit: Attending: Hand Surgery | Admitting: Hand Surgery

## 2020-07-31 ENCOUNTER — Other Ambulatory Visit

## 2020-07-31 NOTE — Progress Notes (Signed)
[facility] Note    Patient Name: [name]  MRN: [id number]    Chief Complaint: Status post excision of calcium deposit, left middle finger and application of digit widget, left small finger, 07/27/2020    History of Present Illness:  Follow-up.  [name] is now 4 days status post the above procedures.  She is doing well and does not complain of any significant pain.  She is here today to change the neoprene sleeve to her digit widget and for instruction in how to do so.  She has been keeping her hand clean and dry..    Allergies:  Allergies   Allergen Reactions   ? Cephalexin Shortness of breath and Nausea And Vomiting     NAUSEA  NAUSEA  Tolerated Cefazolin on 03/09/18 for GYN procedure    NAUSEA  Other reaction(s): GI Intolerance  Rxn: Vomiting  NAUSEA  NAUSEA    NAUSEA  NAUSEA  NAUSEA  Other reaction(s): GI Intolerance  Rxn: Vomiting  NAUSEA  NAUSEA  NAUSEA  NAUSEA  NAUSEA  Other reaction(s): GI Intolerance  Rxn: Vomiting  NAUSEA  NAUSEA  Rxn: Vomiting  NAUSEA     ? Ciprofloxacin Anaphylaxis, Rash and Shortness of breath     Other reaction(s): Headache  Other reaction(s): Headache  Other reaction(s): Headache  Other reaction(s): Headache  Other reaction(s): Headache   ? Dextroamphetamine-Amphetamine Swelling     Patient reports swelling of gums. Symptom dissipated without treatment. As of 6/1 pt reports she is not allergic to Adderrall.  Patient reports swelling of gums. Symptom dissipated without treatment. As of 6/1 pt reports she is not allergic to Adderrall.  Patient reports swelling of gums. Symptom dissipated without treatment. As of 6/1 pt reports she is not allergic to Adderrall.  Patient reports swelling of gums. Symptom dissipated without treatment. As of 6/1 pt reports she is not allergic to Adderrall.  Patient reports swelling of gums. Symptom dissipated without treatment. As of 6/1 pt reports she is not allergic to Adderrall.  Patient reports swelling of gums. Symptom dissipated  without treatment. As of 6/1 pt reports she is not allergic to Adderrall.  Patient reports swelling of gums. Symptom dissipated without treatment. As of 6/1 pt reports she is not allergic to Adderrall.  Patient reports swelling of gums. Symptom dissipated without treatment. As of 6/1 pt reports she is not allergic to Adderrall.   ? Doxycycline Rash and Shortness of breath     Other reaction(s): Difficulty breathing   ? Prednisone Rash   ? Sodium Swelling     Anything with sodium       Medical History:  Past Medical History:   Diagnosis Date   ? Hypothyroidism    ? PTSD (post-traumatic stress disorder)        Surgical History:  Past Surgical History:   Procedure Laterality Date   ? OTHER SURGICAL HISTORY      bone spurs   ? RHINOPLASTY         Current Medications:  Current Outpatient Medications   Medication Sig Dispense Refill   ? gabapentin (Neurontin) 100 mg capsule Take 1 capsule (100 mg) by mouth in the morning, at noon, and at bedtime. 21 capsule 0   ? IMMUN GLOB G-SORB-GLY-IGA 0-50 IV Infuse into a venous catheter. Weekly infusion     ? NON FORMULARY Compounded thyroid medication     ? oxyCODONE (Roxicodone) 5 mg immediate release tablet Take 1 tablet (5 mg) by mouth every 4 (four)  hours if needed for pain score 7-10 for up to 7 days. 10 tablet 0     No current facility-administered medications for this visit.       Social history:  Tobacco Use: Low Risk    ? Smoking Tobacco Use: Never Smoker   ? Smokeless Tobacco Use: Never Used     Alcohol Use: Not on file       Physical Exam  On examination today she is a well-appearing, pleasant female in no apparent distress.  On examination of her left hand the digit widget is in place.  The hand is warm and well-perfused.  She has obtained significant correction of her small finger PIP joint contracture.  It is currently approximately 45 to 50 degrees.  The left middle finger incision is healing well without erythema, drainage or evidence of infection.  Sutures are in  place.    Imaging:       Problem List Items Addressed This Visit    None          Assessment/Plan:  The patient was seen today with Dr. Rodman Pickle.  She was instructed in how to change the neoprene sleeve for her digit widget.  A light weight rubber band was applied today.  She was advised to change the bandage every 3 days.  She was also provided with medium weight bands.  A new dressing was placed on her middle finger.  She should keep her hand clean and dry.  She has paperwork that she has dropped off previously for short-term disability.  She plans to use this for approximately 3 weeks.  She would also like workplace accommodations.  She would like to work from home as she is set up for typing with 1 hand at home.  In addition she has difficulty with driving currently.  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  She has paperwork also for nursing care at home to assist with her infusions.  For infusions are done once a week.  She is unable to do this one-handed and requires the assistance of a nurse.  She will follow-up in 2-1/2 weeks for suture removal and reevaluation.I saw and evaluated the patient, participating in the key portions of the service.  I reviewed the resident/fellow/PA note.  I agree with the findings and documentation pertinent to the management of this patient.       Daphine Deutscher, PA

## 2020-08-01 ENCOUNTER — Telehealth (HOSPITAL_BASED_OUTPATIENT_CLINIC_OR_DEPARTMENT_OTHER): Admitting: Orthopaedic Surgery

## 2020-08-01 NOTE — Telephone Encounter (Signed)
Received call from patient this afternoon with complaints of increased pain after swapping out the bands on her digit widget yesterday. States it feels like her finger is being pulled/stretched. Denies redness, drainage in the area. Discussed that the device is designed to stretch out her finger, and that what she's feeling is not unexpected. Advised that she should swap out the bands every three days as directed and keep her follow up with Dr Rodman Pickle in a few weeks. Patient reassured, agrees with plan.

## 2020-08-07 ENCOUNTER — Ambulatory Visit (HOSPITAL_BASED_OUTPATIENT_CLINIC_OR_DEPARTMENT_OTHER): Admitting: Hand Surgery

## 2020-08-11 ENCOUNTER — Telehealth (HOSPITAL_BASED_OUTPATIENT_CLINIC_OR_DEPARTMENT_OTHER): Admitting: Hand Surgery

## 2020-08-11 NOTE — Telephone Encounter (Signed)
Patient called, requests a call back to discuss paperwork that was to be filed to her employer.    She can be reached at [phone]. OK to LVM.

## 2020-08-18 ENCOUNTER — Encounter (HOSPITAL_BASED_OUTPATIENT_CLINIC_OR_DEPARTMENT_OTHER): Admitting: Hand Surgery

## 2020-08-18 ENCOUNTER — Other Ambulatory Visit

## 2020-08-18 ENCOUNTER — Ambulatory Visit: Attending: Physician Assistant | Admitting: Hand Surgery

## 2020-08-18 DIAGNOSIS — M24542 Contracture, left hand: Secondary | ICD-10-CM

## 2020-08-18 NOTE — Progress Notes (Signed)
Orthopedic Clinic Note    Patient Name: [name]  MRN: [id number]    Chief Complaint: Status post excision of calcium deposit, left middle finger and application of digit widget for PIP contracture left small finger, date of surgery 07/27/2020    History of Present Illness:  [name] is a 35 y.o. female presents for interval follow-up status post above-mentioned procedure.  Now 3 weeks out.  She notes marked improvement in regards to her small finger contracture.  She has had no difficulty with the digit widget.  She reports she is been compliant with keeping the hand clean and dry particular regarding her middle finger wound.  She has been changing the dressing accordingly.  She has not been working on PIP motion of the small finger. Overall, she is very pleased with her outcome thus far. She has little pain.     Allergies:  Allergies   Allergen Reactions   ? Cephalexin Shortness of breath and Nausea And Vomiting     NAUSEA  NAUSEA  Tolerated Cefazolin on 03/09/18 for GYN procedure    NAUSEA  Other reaction(s): GI Intolerance  Rxn: Vomiting  NAUSEA  NAUSEA    NAUSEA  NAUSEA  NAUSEA  Other reaction(s): GI Intolerance  Rxn: Vomiting  NAUSEA  NAUSEA  NAUSEA  NAUSEA  NAUSEA  Other reaction(s): GI Intolerance  Rxn: Vomiting  NAUSEA  NAUSEA  Rxn: Vomiting  NAUSEA     ? Ciprofloxacin Anaphylaxis, Rash and Shortness of breath     Other reaction(s): Headache  Other reaction(s): Headache  Other reaction(s): Headache  Other reaction(s): Headache  Other reaction(s): Headache   ? Dextroamphetamine-Amphetamine Swelling     Patient reports swelling of gums. Symptom dissipated without treatment. As of 6/1 pt reports she is not allergic to Adderrall.  Patient reports swelling of gums. Symptom dissipated without treatment. As of 6/1 pt reports she is not allergic to Adderrall.  Patient reports swelling of gums. Symptom dissipated without treatment. As of 6/1 pt reports she is not allergic to Adderrall.  Patient reports  swelling of gums. Symptom dissipated without treatment. As of 6/1 pt reports she is not allergic to Adderrall.  Patient reports swelling of gums. Symptom dissipated without treatment. As of 6/1 pt reports she is not allergic to Adderrall.  Patient reports swelling of gums. Symptom dissipated without treatment. As of 6/1 pt reports she is not allergic to Adderrall.  Patient reports swelling of gums. Symptom dissipated without treatment. As of 6/1 pt reports she is not allergic to Adderrall.  Patient reports swelling of gums. Symptom dissipated without treatment. As of 6/1 pt reports she is not allergic to Adderrall.   ? Doxycycline Rash and Shortness of breath     Other reaction(s): Difficulty breathing   ? Prednisone Rash   ? Sodium Swelling     Anything with sodium       Medical History:  Past Medical History:   Diagnosis Date   ? Hypothyroidism    ? PTSD (post-traumatic stress disorder)        Surgical History:  Past Surgical History:   Procedure Laterality Date   ? OTHER SURGICAL HISTORY      bone spurs   ? RHINOPLASTY         Current Medications:  Current Outpatient Medications   Medication Sig Dispense Refill   ? gabapentin (Neurontin) 100 mg capsule Take 1 capsule (100 mg) by mouth in the morning, at noon, and at bedtime. 21 capsule 0   ?  IMMUN GLOB G-SORB-GLY-IGA 0-50 IV Infuse into a venous catheter. Weekly infusion     ? NON FORMULARY Compounded thyroid medication       No current facility-administered medications for this visit.       Social history:  Tobacco Use: Low Risk    ? Smoking Tobacco Use: Never Smoker   ? Smokeless Tobacco Use: Never Used     Alcohol Use: Not on file       Physical Exam  Gen: well-appearing, in NAD, A&O  On examination of her left hand the digit widget is in place.  There is no significant swelling. There is no drainage from pin site. The hand is warm and well-perfused.  She has obtained significant correction of her small finger PIP joint contracture.  It is currently  approximately 10-15 degrees.  The left middle finger incision is healing well without erythema, drainage or evidence of infection.  Sutures are in place. Fingers are warm and well perfused.      Diagnosis Plan   1. Contracture of finger joint, left  Ambulatory referral to Occupational Therapy   2. Scleroderma (CMS/HCC)       Assessment/Plan:  Patient was seen and examined with Dr. Rodman Pickle. Sutures were removed today from the middle finger. She will continue to keep the wound clean and dry. We are very pleased with how her small finger appears. Plan is for the digit widget to remain on for the next 3 weeks.  She was instructed to remove the velcro several times daily for flexion exercises.  We will see her back in 3 weeks at which time plan is to remove digit widget, she has OT appt set up for fabrication of thermaplast and LMB splints. We discussed importance of PIP flexion in the interim and she will be going to occupational therapy after digit widget removal. She was given extended medical leave until mid-late July and paperwork was filled out accordingly. All questions/concerns addressed. I saw and evaluated the patient, participating in the key portions of the service.  I reviewed the resident/fellow/PA note.  I agree with the findings and documentation pertinent to the management of this patient.       Kathrine Cords, PA

## 2020-08-31 NOTE — Telephone Encounter (Signed)
Patient is calling in regards to her FMLA paperwork, please contact patient.  Thank you.

## 2020-09-05 ENCOUNTER — Telehealth (HOSPITAL_BASED_OUTPATIENT_CLINIC_OR_DEPARTMENT_OTHER): Admitting: Hand Surgery

## 2020-09-05 NOTE — Telephone Encounter (Signed)
Patient called and stated she is having a hard time with getting her short term disability sent over for work. She has not heard back about it and she needs it as soon as possible. Please contact patient at [phone]. Thanks

## 2020-09-06 ENCOUNTER — Encounter (HOSPITAL_BASED_OUTPATIENT_CLINIC_OR_DEPARTMENT_OTHER): Admitting: Hand Surgery

## 2020-09-06 ENCOUNTER — Ambulatory Visit: Attending: Physician Assistant

## 2020-09-06 ENCOUNTER — Ambulatory Visit: Attending: Hand Surgery | Admitting: Hand Surgery

## 2020-09-06 ENCOUNTER — Other Ambulatory Visit

## 2020-09-06 DIAGNOSIS — M24542 Contracture, left hand: Secondary | ICD-10-CM

## 2020-09-06 NOTE — Progress Notes (Signed)
[facility] Note    Patient Name: [name]  MRN: [id number]    Chief Complaint: Status post excision of calcium deposit, left middle finger and application of digit widget for PIP contracture left small finger, date of surgery 07/27/2020    History of Present Illness:  [name] is a 35 y.o. female presents for interval follow-up status post above-mentioned procedure.  Now 6 weeks out.  She notes improvement in regards to her small finger contracture.  She has had no difficulty with the digit widget. She is here for digit widget removal. Overall, she is very pleased with her outcome thus far. She has little pain.     Allergies:  Allergies   Allergen Reactions   ? Cephalexin Shortness of breath and Nausea And Vomiting     NAUSEA  NAUSEA  Tolerated Cefazolin on 03/09/18 for GYN procedure    NAUSEA  Other reaction(s): GI Intolerance  Rxn: Vomiting  NAUSEA  NAUSEA    NAUSEA  NAUSEA  NAUSEA  Other reaction(s): GI Intolerance  Rxn: Vomiting  NAUSEA  NAUSEA  NAUSEA  NAUSEA  NAUSEA  Other reaction(s): GI Intolerance  Rxn: Vomiting  NAUSEA  NAUSEA  Rxn: Vomiting  NAUSEA     ? Ciprofloxacin Anaphylaxis, Rash and Shortness of breath     Other reaction(s): Headache  Other reaction(s): Headache  Other reaction(s): Headache  Other reaction(s): Headache  Other reaction(s): Headache   ? Dextroamphetamine-Amphetamine Swelling     Patient reports swelling of gums. Symptom dissipated without treatment. As of 6/1 pt reports she is not allergic to Adderrall.  Patient reports swelling of gums. Symptom dissipated without treatment. As of 6/1 pt reports she is not allergic to Adderrall.  Patient reports swelling of gums. Symptom dissipated without treatment. As of 6/1 pt reports she is not allergic to Adderrall.  Patient reports swelling of gums. Symptom dissipated without treatment. As of 6/1 pt reports she is not allergic to Adderrall.  Patient reports swelling of gums. Symptom dissipated without treatment. As of 6/1 pt  reports she is not allergic to Adderrall.  Patient reports swelling of gums. Symptom dissipated without treatment. As of 6/1 pt reports she is not allergic to Adderrall.  Patient reports swelling of gums. Symptom dissipated without treatment. As of 6/1 pt reports she is not allergic to Adderrall.  Patient reports swelling of gums. Symptom dissipated without treatment. As of 6/1 pt reports she is not allergic to Adderrall.   ? Doxycycline Rash and Shortness of breath     Other reaction(s): Difficulty breathing   ? Prednisone Rash   ? Sodium Swelling     Anything with sodium       Medical History:  Past Medical History:   Diagnosis Date   ? Hypothyroidism    ? PTSD (post-traumatic stress disorder)        Surgical History:  Past Surgical History:   Procedure Laterality Date   ? OTHER SURGICAL HISTORY      bone spurs   ? RHINOPLASTY         Current Medications:  Current Outpatient Medications   Medication Sig Dispense Refill   ? gabapentin (Neurontin) 100 mg capsule Take 1 capsule (100 mg) by mouth in the morning, at noon, and at bedtime. 21 capsule 0   ? IMMUN GLOB G-SORB-GLY-IGA 0-50 IV Infuse into a venous catheter. Weekly infusion     ? NON FORMULARY Compounded thyroid medication       No current facility-administered medications for  this visit.       Social history:  Tobacco Use: Low Risk    ? Smoking Tobacco Use: Never Smoker   ? Smokeless Tobacco Use: Never Used     Alcohol Use: Not on file       Physical Exam  Gen: well-appearing, in NAD, A&O  On examination of her left hand the digit widget is in place.  There is no significant swelling. There is no drainage from pin site. The hand is warm and well-perfused.  She has obtained significant correction of her small finger PIP joint contracture.  It is currently approximately 10-15 degrees. Sensation is intact to light touch throughout the hand. Fingers are warm and well perfused.     Foreign Body Removal    Date/Time: 09/06/2020 11:00 AM  Performed by: [name], MD  Authorized by: [name], MD     Consent:     Consent obtained:  Verbal    Consent given by:  Patient    Risks, benefits, and alternatives were discussed: yes      Risks discussed:  Bleeding and pain    Alternatives discussed:  No treatment  Universal protocol:     Patient identity confirmed:  Verbally with patient  Location:     Location:  Finger    Finger location:  L little finger    Tendon involvement:  None  Pre-procedure details:     Neurovascular status: intact    Anesthesia:     Anesthesia method:  Local infiltration    Local anesthetic:  Lidocaine 1% w/o epi  Procedure type:     Procedure complexity:  Simple  Post-procedure details:     Skin closure:  None    Dressing:  Sterile dressing    Procedure completion:  Tolerated well, no immediate complications  Comments:      Skin was cleaned with alcohol, needle driver was used to pull two pins from the left small finger. Sterile bandage applied.         Diagnosis Plan   1. Contracture of finger joint, left  Ambulatory referral to Occupational Therapy   2. Scleroderma (CMS/HCC)       Assessment/Plan:  Patient was seen and examined with Dr. Rodman Pickle. The digit widget and pins were removed today from the small finger. She will continue to keep the wound clean and dry, was advised to avoid submerging the wound for about a week. She was seen by OT for fabrication of thermaplast splint and LMB splint. She will alternate wearing these. Will work on motion otherwise. She will work on exercises on her own. We discussed expectations regarding her recovery. We will see her back in 4 weeks for re-evaluation, possible planning for her right small finger.Marland Kitchen She was given extended medical leave until mid-late July and paperwork was filled out accordingly. All questions/concerns addressed.    I saw and evaluated the patient, participating in the key portions of the service.  I reviewed the resident/fellow/PA note.  I agree with the findings and documentation  pertinent to the management of this patient.       Kathrine Cords, PA

## 2020-09-06 NOTE — Progress Notes (Addendum)
[facility]   HAND THERAPY    Orthotic Note  Type: Occupational Therapy      Patient Name: [name], [name]   Date of Report: 09/06/2020  Date of Birth: 11-30-1985  MR#: [id number]   Onset Date of Condition: chronic- Scleroderma  Date of Surgery: 07/27/20 Digit widget  Referring Physician: No ref. provider found  Treatment Diagnosis: PIP joint contracture secondary to scleroderma    Precautions: skin integrity      Subjective:    [name] is a 35 y.o.  female who has been referred to hand therapy s/p removal of Digit widget. Pt had place on SF on 07/27/20. PT referred to hand therapy for splinting. Spoke with Dr. Rodman Pickle who verbalized he would like Pt to have a LMB extension splint and digits static extension splint. We also discussed implementing a HB resting Splint with MPs slightly flexed should she posture in hyperextension at the MPs    PMH/PSH:    She has a past medical history of Hypothyroidism and PTSD (post-traumatic stress disorder). [name] has a past surgical history that includes Other surgical history and Rhinoplasty.      Pain:  [] Min  [x] Moderate  [] Severe  [] None  [] Other:    ROM Limitations:  [] Min  [x] Moderate  [] Severe  [] None  [] Other:    Edema:  [x] Min  [] Moderate  [] Severe  [] None  [] Other:    Sensory Changes  [x] None  [] Yes:    Referred for the following orthosis: finger Gutter Static extension.   Wearing Schedule:  [x]  All the time, except skin care  [] All the time, including showers  [] At night to bed and during rest periods  [] During work or high risk/ strenuous activities  [] Removal only for exercises as instructed by Therapist  []  Other:     Treatment Provided:  [x] The above orthosis was fabricated/custom fitted. Patient was provided with written Sinda Du education on proper application, wearing schedule, activity levels and precautions. Patient was instructed to contact the hand therapydepartment should questions or problems arise.  [] Please see HEP sheet for details.    Short Term  Goals:  [x] Patient will demonstrate correct orthosis use by the end of today?s session  [] Patient will demonstrate home exercise program by the end of today?s session    Long Term Goals:  [x] Patient will perform exercises and/or wear orthosis as prescribed by therapist and physician to protect and regain upper  extremity function    Assessment:  [x] Patient has achieved all short / long term goals listed above  [] Other    Plan:  [x] Pt to follow up with HT services for evaluation and treatment Next week. Unable to implement LMB splint today due to pin removal and open wounds. Pt educated on use and provided with to use once wounds have healed with good skin integrity. Pt highly encouraged to contact or call hand therapy with any questions or concerns regarding the fit of the splint.         Treatment Time: [address]      Basil Dess OTR/L, [place]  09/06/2020

## 2020-09-11 ENCOUNTER — Encounter (HOSPITAL_BASED_OUTPATIENT_CLINIC_OR_DEPARTMENT_OTHER): Admitting: Hand Surgery

## 2020-09-11 ENCOUNTER — Other Ambulatory Visit

## 2020-09-11 ENCOUNTER — Ambulatory Visit: Attending: Hand Surgery | Admitting: Hand Surgery

## 2020-09-11 ENCOUNTER — Telehealth (HOSPITAL_BASED_OUTPATIENT_CLINIC_OR_DEPARTMENT_OTHER): Admitting: Hand Surgery

## 2020-09-11 ENCOUNTER — Ambulatory Visit (HOSPITAL_BASED_OUTPATIENT_CLINIC_OR_DEPARTMENT_OTHER)

## 2020-09-11 DIAGNOSIS — M24542 Contracture, left hand: Secondary | ICD-10-CM

## 2020-09-11 NOTE — Progress Notes (Signed)
[facility]   HAND THERAPY    Orthotic Note  Type: Occupational Therapy      Patient Name: [name], [name]   Date of Report: 09/11/2020  Date of Birth: June 17, 1985  MR#: [id number]   Onset Date of Condition: chronic- Scleroderma  Date of Surgery: 07/27/20 Digit widget  Referring Physician: Kathrine Cords, PA  Treatment Diagnosis: PIP joint contracture secondary to scleroderma    Precautions: skin integrity      Subjective:    [name] is a 35 y.o.  female who returns to hand therapy s/p removal of Digit widget. Pt had place on SF on 07/27/20. PT referred to hand therapy for splinting. Spoke with Dr. Rodman Pickle who verbalized he would like Pt to have a LMB extension splint and digits static extension splint. We also discussed implementing a HB resting Splint with MPs slightly flexed should she posture in hyperextension at the MPs. SPlint was fabricated 1 week ago.     Pt returns today for remolding of finger gutter splint secondary to decrease in swelling and fabrication of HB Resting with MPs slightly flexed     PMH/PSH:    She has a past medical history of Hypothyroidism and PTSD (post-traumatic stress disorder). [name] has a past surgical history that includes Other surgical history and Rhinoplasty.      Pain:  [] Min  [x] Moderate  [] Severe  [] None  [] Other:    ROM Limitations:  [] Min  [x] Moderate  [] Severe  [] None  [] Other:    Edema:  [x] Min  [] Moderate  [] Severe  [] None  [] Other:    Sensory Changes  [x] None  [] Yes:    Referred for the following orthosis: HB resting including the MPs slightly flexed   Wearing Schedule:  [x]  All the time, except skin care  [] All the time, including showers  [] At night to bed and during rest periods  [] During work or high risk/ strenuous activities  [] Removal only for exercises as instructed by Therapist  []  Other:     Treatment Provided:  [x] The above orthosis was fabricated/custom fitted. Patient was provided with written Sinda Du education on proper application, wearing schedule, activity  levels and precautions. Patient was instructed to contact the hand therapydepartment should questions or problems arise.  [] Please see HEP sheet for details.    Short Term Goals:  [x] Patient will demonstrate correct orthosis use by the end of today?s session  [] Patient will demonstrate home exercise program by the end of today?s session    Long Term Goals:  [x] Patient will perform exercises and/or wear orthosis as prescribed by therapist and physician to protect and regain upper  extremity function    Assessment:  [x] Patient has achieved all short / long term goals listed above  [] Other    Plan:  [x] Pt to follow up with HT services for evaluation and treatment This week. Pending insurance referral from PCP. Pt has been provided with required information to obtain one.         Treatment Time: [address]      Basil Dess OTR/L, [place]  09/11/2020

## 2020-09-11 NOTE — Telephone Encounter (Signed)
Hi,  Patient is looking to come in as soon as possible to see Dr. Rodman Pickle. Patient is having a recurrent issue. Patient stated that email will be the best way to contact patient.   [email or web address]

## 2020-09-11 NOTE — Progress Notes (Signed)
[facility] Note    Patient Name: [name]  MRN: [id number]    Chief Complaint: left small finger contracture    History of Present Illness:  [name] is a 35 y.o. female who presents for follow up of her right small finger contracture. She had a digit widget placed on 07/27/20 for treatment of her contracture and it was removed on 09/06/20. She says that since that time she has lost a small amount of extension in the finger. She also reports pain with wearing the splint. She does not have any weakness or changes in sensation. She has several questions in regards to the digit widget, including whether it can be placed again since she believes her contracture is worsening.     Allergies:  Allergies   Allergen Reactions   ? Cephalexin Shortness of breath and Nausea And Vomiting     NAUSEA  NAUSEA  Tolerated Cefazolin on 03/09/18 for GYN procedure    NAUSEA  Other reaction(s): GI Intolerance  Rxn: Vomiting  NAUSEA  NAUSEA    NAUSEA  NAUSEA  NAUSEA  Other reaction(s): GI Intolerance  Rxn: Vomiting  NAUSEA  NAUSEA  NAUSEA  NAUSEA  NAUSEA  Other reaction(s): GI Intolerance  Rxn: Vomiting  NAUSEA  NAUSEA  Rxn: Vomiting  NAUSEA     ? Ciprofloxacin Anaphylaxis, Rash and Shortness of breath     Other reaction(s): Headache  Other reaction(s): Headache  Other reaction(s): Headache  Other reaction(s): Headache  Other reaction(s): Headache   ? Dextroamphetamine-Amphetamine Swelling     Patient reports swelling of gums. Symptom dissipated without treatment. As of 6/1 pt reports she is not allergic to Adderrall.  Patient reports swelling of gums. Symptom dissipated without treatment. As of 6/1 pt reports she is not allergic to Adderrall.  Patient reports swelling of gums. Symptom dissipated without treatment. As of 6/1 pt reports she is not allergic to Adderrall.  Patient reports swelling of gums. Symptom dissipated without treatment. As of 6/1 pt reports she is not allergic to Adderrall.  Patient reports swelling of  gums. Symptom dissipated without treatment. As of 6/1 pt reports she is not allergic to Adderrall.  Patient reports swelling of gums. Symptom dissipated without treatment. As of 6/1 pt reports she is not allergic to Adderrall.  Patient reports swelling of gums. Symptom dissipated without treatment. As of 6/1 pt reports she is not allergic to Adderrall.  Patient reports swelling of gums. Symptom dissipated without treatment. As of 6/1 pt reports she is not allergic to Adderrall.   ? Doxycycline Rash and Shortness of breath     Other reaction(s): Difficulty breathing   ? Prednisone Rash   ? Sodium Swelling     Anything with sodium       Medical History:  Past Medical History:   Diagnosis Date   ? Hypothyroidism    ? PTSD (post-traumatic stress disorder)        Surgical History:  Past Surgical History:   Procedure Laterality Date   ? OTHER SURGICAL HISTORY      bone spurs   ? RHINOPLASTY         Current Medications:  Current Outpatient Medications   Medication Sig Dispense Refill   ? gabapentin (Neurontin) 100 mg capsule Take 1 capsule (100 mg) by mouth in the morning, at noon, and at bedtime. 21 capsule 0   ? IMMUN GLOB G-SORB-GLY-IGA 0-50 IV Infuse into a venous catheter. Weekly infusion     ? NON  FORMULARY Compounded thyroid medication       No current facility-administered medications for this visit.       Social history:  Tobacco Use: Low Risk    ? Smoking Tobacco Use: Never Smoker   ? Smokeless Tobacco Use: Never Used     Alcohol Use: Not on file       Physical Exam  Gen: well-appearing, in NAD    Left hand  Skin overlying hand is tight and shiny with mild peeling at finger tips. Small finger held in flexion after removal of splint. Non-tender to palpation. Full active and passive range of motion at wrist and fingers, except small finger PIP at -30. Sensation to light touch intact in median, ulnar, and radial nerve distributions. Hand warm and well perfused.     Imaging:   n/a    Procedure:  n/a     Diagnosis  Plan   1. Contracture of finger joint, left            Assessment/Plan:  [name] is a 35 yo woman who presents for follow up one week after removal of her digit widget. She has been wearing her splint, but reports pain and discomfort while wearing it. Her contracture is starting to progress somewhat since removal of the digit widget. It is not appropriate to replace the digit widget at this time. Given this, we will trial a serial casting. The cast should cover the MTP joint to better capture the full length of the contracture. She should follow up as scheduled. All patient questions were answered and she is in agreement with the plan at this time. She was instructed to call the office with further questions or concerns. I, [name] MD,  saw and evaluated the patient, participating in the key portions of the service.  I reviewed the resident/fellow/PA note.  I agree with the findings and documentation pertinent to the management of this patient.       Doneta Public

## 2020-09-12 ENCOUNTER — Encounter (HOSPITAL_BASED_OUTPATIENT_CLINIC_OR_DEPARTMENT_OTHER): Admitting: Hand Surgery

## 2020-09-12 ENCOUNTER — Ambulatory Visit (HOSPITAL_BASED_OUTPATIENT_CLINIC_OR_DEPARTMENT_OTHER)

## 2020-09-15 ENCOUNTER — Ambulatory Visit (HOSPITAL_BASED_OUTPATIENT_CLINIC_OR_DEPARTMENT_OTHER)

## 2020-10-04 ENCOUNTER — Ambulatory Visit (HOSPITAL_BASED_OUTPATIENT_CLINIC_OR_DEPARTMENT_OTHER): Admitting: Hand Surgery

## 2020-10-09 ENCOUNTER — Ambulatory Visit: Attending: Hand Surgery | Admitting: Hand Surgery

## 2020-10-09 ENCOUNTER — Encounter (HOSPITAL_BASED_OUTPATIENT_CLINIC_OR_DEPARTMENT_OTHER): Admitting: Hand Surgery

## 2020-10-09 ENCOUNTER — Other Ambulatory Visit

## 2020-10-09 DIAGNOSIS — M24542 Contracture, left hand: Secondary | ICD-10-CM

## 2020-10-09 NOTE — Progress Notes (Signed)
 [facility] Note    Patient Name: [name]  MRN: [id number]    Chief Complaint: Status post excision of calcium deposit, left middle finger and application of digit widget for PIP contracture left small finger, date of surgery 07/27/2020, removal of digit widget on 09/06/20.    History of Present Illness:  [name] is a 35 y.o. female presents for interval follow-up status post above-mentioned procedure.  Now 5 weeks out from digit widget removal. She notes painful reoccurrence of her SF PIP joint contracture since the digit widget was removed. She had multiple fittings with OT since then for a LMB splint, but she has not been able to tolerate this and is now only wearing it occasionally at night time. She also is reporting painful parashesias over the ulnar aspect of her IF at the DIP joint which she thinks is either a buried retained suture from previous surgery for removal of a calcium deposit, or an irritated nerve.    Allergies:  Allergies   Allergen Reactions   ? Cephalexin Shortness of breath and Nausea And Vomiting     NAUSEA  NAUSEA  Tolerated Cefazolin on 03/09/18 for GYN procedure    NAUSEA  Other reaction(s): GI Intolerance  Rxn: Vomiting  NAUSEA  NAUSEA    NAUSEA  NAUSEA  NAUSEA  Other reaction(s): GI Intolerance  Rxn: Vomiting  NAUSEA  NAUSEA  NAUSEA  NAUSEA  NAUSEA  Other reaction(s): GI Intolerance  Rxn: Vomiting  NAUSEA  NAUSEA  Rxn: Vomiting  NAUSEA     ? Ciprofloxacin Anaphylaxis, Rash and Shortness of breath     Other reaction(s): Headache  Other reaction(s): Headache  Other reaction(s): Headache  Other reaction(s): Headache  Other reaction(s): Headache   ? Dextroamphetamine-Amphetamine Swelling     Patient reports swelling of gums. Symptom dissipated without treatment. As of 6/1 pt reports she is not allergic to Adderrall.  Patient reports swelling of gums. Symptom dissipated without treatment. As of 6/1 pt reports she is not allergic to Adderrall.  Patient reports swelling of gums.  Symptom dissipated without treatment. As of 6/1 pt reports she is not allergic to Adderrall.  Patient reports swelling of gums. Symptom dissipated without treatment. As of 6/1 pt reports she is not allergic to Adderrall.  Patient reports swelling of gums. Symptom dissipated without treatment. As of 6/1 pt reports she is not allergic to Adderrall.  Patient reports swelling of gums. Symptom dissipated without treatment. As of 6/1 pt reports she is not allergic to Adderrall.  Patient reports swelling of gums. Symptom dissipated without treatment. As of 6/1 pt reports she is not allergic to Adderrall.  Patient reports swelling of gums. Symptom dissipated without treatment. As of 6/1 pt reports she is not allergic to Adderrall.   ? Doxycycline Rash and Shortness of breath     Other reaction(s): Difficulty breathing   ? Prednisone Rash   ? Sodium Swelling     Anything with sodium       Medical History:  Past Medical History:   Diagnosis Date   ? Hypothyroidism    ? PTSD (post-traumatic stress disorder)        Surgical History:  Past Surgical History:   Procedure Laterality Date   ? OTHER SURGICAL HISTORY      bone spurs   ? RHINOPLASTY         Current Medications:  Current Outpatient Medications   Medication Sig Dispense Refill   ? gabapentin (Neurontin) 100 mg capsule Take  1 capsule (100 mg) by mouth in the morning, at noon, and at bedtime. 21 capsule 0   ? IMMUN GLOB G-SORB-GLY-IGA 0-50 IV Infuse into a venous catheter. Weekly infusion     ? NON FORMULARY Compounded thyroid medication       No current facility-administered medications for this visit.       Social history:  Tobacco Use: Low Risk    ? Smoking Tobacco Use: Never Smoker   ? Smokeless Tobacco Use: Never Used     Alcohol Use: Not on file       Physical Exam  Gen: well-appearing, in NAD, A&O    On examination of her left hand the digit widget is in place. ?There is no significant swelling. There is no drainage from pin site, incisions healed. The hand is warm  and well-perfused. ?PIP joint contracture of 40 degrees, can flex to 95 degrees. No change in PIP ROM with MP flexion or extension. Sensation is intact to light touch throughout the hand. Fingers are warm and well perfused.     R IF- small subcutaneous nodules at the ulnar border of the DIP joint. These are painful to palpation, with parasthesias radiating distally. No erythema, skin intact. NO visible suture material.         Diagnosis Plan   1. Contracture of finger joint, left  Ambulatory referral to Occupational Therapy   2. Scleroderma (CMS/HCC)       Assessment/Plan:  Patient was seen and examined with Dr. Marena Chancy.  She is now 5 weeks status post the above operations performed by Dr. Rodman Pickle.  Is unclear why she has been scheduled and Dr. Yvonne Kendall clinic today, as she is a longtime patient of Dr. Rodman Pickle, who has operated on her several times.  At this time we have recommended that she continue to use her LMB extension splint for her left small finger PIP contracture.  We discussed that her only likely options for this contracture at this time are nonoperative treatment, or PIP joint fusion, given that she has now failed the digit widget.  As for her right index finger pain and nodules, this is likely either calcium deposit or neuroma.  However the patient firmly believes that this is not a calcium deposit, as she has had multiple prior calcium deposits and they did not appear like this.  We explained that surgery to excise a painful neuroma would be unlikely to resolve her symptoms, and only cause further scar formation and or nerve irritation.  Instead, we have recommended nerve desensitization therapy, and encouraged her to massage this area frequently.  We recommended that she follow-up with Dr. Rodman Pickle when he returns from vacation, in order to discuss further treatment.    Jackson Latino, MD

## 2020-11-17 ENCOUNTER — Other Ambulatory Visit

## 2020-11-17 ENCOUNTER — Encounter (HOSPITAL_BASED_OUTPATIENT_CLINIC_OR_DEPARTMENT_OTHER): Admitting: Hand Surgery

## 2020-11-17 ENCOUNTER — Ambulatory Visit
Admission: RE | Admit: 2020-11-17 | Discharge: 2020-11-17 | Disposition: A | Discharge status: Home / Self Care | Source: Ambulatory Visit | Attending: Hand Surgery | Admitting: Hand Surgery

## 2020-11-17 ENCOUNTER — Ambulatory Visit (HOSPITAL_BASED_OUTPATIENT_CLINIC_OR_DEPARTMENT_OTHER): Admitting: Hand Surgery

## 2020-11-17 DIAGNOSIS — M24542 Contracture, left hand: Secondary | ICD-10-CM

## 2020-11-17 NOTE — Progress Notes (Signed)
 [facility] Note    Patient Name: [name]  MRN: [id number]    Chief Complaint: Status post excision of calcium deposit, left middle finger and application of digit widget for PIP contracture left small finger, date of surgery 07/27/2020, removal of digit widget on 09/06/20.    History of Present Illness:  [name] is a 35 y.o. female presents for interval follow-up status post above-mentioned procedure.  Now 9 weeks out from digit widget removal. She notes painful reoccurrence of her SF PIP joint contracture since the digit widget was removed. She had multiple fittings with OT since then for a LMB splint, but she has not been able to tolerate this and is now only wearing it occasionally at night time. She also is reporting painful parashesias over the ulnar aspect of her IF at the DIP joint which she thinks is either a buried retained suture from previous surgery for removal of a calcium deposit, or an irritated nerve.  Furthermore, she has pain and prominence over the volar aspect of her right middle finger PIP joint.    Allergies:  Allergies   Allergen Reactions   . Cephalexin Shortness of breath and Nausea And Vomiting     NAUSEA  NAUSEA  Tolerated Cefazolin on 03/09/18 for GYN procedure    NAUSEA  Other reaction(s): GI Intolerance  Rxn: Vomiting  NAUSEA  NAUSEA    NAUSEA  NAUSEA  NAUSEA  Other reaction(s): GI Intolerance  Rxn: Vomiting  NAUSEA  NAUSEA  NAUSEA  NAUSEA  NAUSEA  Other reaction(s): GI Intolerance  Rxn: Vomiting  NAUSEA  NAUSEA  Rxn: Vomiting  NAUSEA     . Ciprofloxacin Anaphylaxis, Rash and Shortness of breath     Other reaction(s): Headache  Other reaction(s): Headache  Other reaction(s): Headache  Other reaction(s): Headache  Other reaction(s): Headache   . Dextroamphetamine-Amphetamine Swelling     Patient reports swelling of gums. Symptom dissipated without treatment. As of 6/1 pt reports she is not allergic to Adderrall.  Patient reports swelling of gums. Symptom dissipated without  treatment. As of 6/1 pt reports she is not allergic to Adderrall.  Patient reports swelling of gums. Symptom dissipated without treatment. As of 6/1 pt reports she is not allergic to Adderrall.  Patient reports swelling of gums. Symptom dissipated without treatment. As of 6/1 pt reports she is not allergic to Adderrall.  Patient reports swelling of gums. Symptom dissipated without treatment. As of 6/1 pt reports she is not allergic to Adderrall.  Patient reports swelling of gums. Symptom dissipated without treatment. As of 6/1 pt reports she is not allergic to Adderrall.  Patient reports swelling of gums. Symptom dissipated without treatment. As of 6/1 pt reports she is not allergic to Adderrall.  Patient reports swelling of gums. Symptom dissipated without treatment. As of 6/1 pt reports she is not allergic to Adderrall.   . Doxycycline Rash and Shortness of breath     Other reaction(s): Difficulty breathing   . Prednisone Rash   . Sodium Swelling     Anything with sodium       Medical History:  Past Medical History:   Diagnosis Date   . Hypothyroidism    . PTSD (post-traumatic stress disorder)        Surgical History:  Past Surgical History:   Procedure Laterality Date   . OTHER SURGICAL HISTORY      bone spurs   . RHINOPLASTY         Current Medications:  Current Outpatient Medications   Medication Sig Dispense Refill   . gabapentin (Neurontin) 100 mg capsule Take 1 capsule (100 mg) by mouth in the morning, at noon, and at bedtime. 21 capsule 0   . IMMUN GLOB G-SORB-GLY-IGA 0-50 IV Infuse into a venous catheter. Weekly infusion     . NON FORMULARY Compounded thyroid medication       No current facility-administered medications for this visit.       Social history:  Tobacco Use: Low Risk    . Smoking Tobacco Use: Never Smoker   . Smokeless Tobacco Use: Never Used     Alcohol Use: Not on file       Physical Exam  Gen: well-appearing, in NAD, A&O    On examination of her left hand there is no significant swelling.  There is no drainage from prior pin site, incisions healed. The hand is warm and well-perfused. PIP joint contracture of 40 degrees with ~30 degree extension lag. can flex to 95 degrees. No change in PIP ROM with MP flexion or extension. Sensation is intact to light touch throughout the hand. Fingers are warm and well perfused.       R IF- small subcutaneous nodules at the ulnar border of the DIP joint. These are painful to palpation, with parasthesias radiating distally. No erythema, skin intact. NO visible suture material.    Right middle finger-medium sized deep nodules the volar aspect of the PIP joint.  Skin intact, no erythema.  No paresthesias in the radial or ulnar aspect of his finger.  Finger warm well perfused.    Imaging:  X-rays of the right hand obtained today and reviewed by me.  These demonstrate calcium deposits right middle finger volar to the PIP joint.  She also has a small calcification on the ulnar aspect of the index finger PIP joint.  No acute fractures or dislocations.     Diagnosis Plan   1. Contracture of finger joint, left  Ambulatory referral to Occupational Therapy   2. Scleroderma (CMS/HCC)       Assessment/Plan:  Patient was seen and examined with Dr. Rodman Pickle.  She is now 9 weeks status post the above operations performed by Dr. Rodman Pickle.  Unfortunately her contracture has reoccured of her L small finger. We discussed that her only likely options for this contracture at this time are nonoperative treatment, or PIP joint fusion, given that she has now failed the digit widget.  However, she has not yet ready for a joint fusion.  She did inquire whether it would be possible to do a revision digit widget and extensor tendon repair/fractional shortening.  With splint at this is not typically an indication for the surgery, and she has already failed the digit which is once which does not portend a favorable outcome for a revision procedure.    As for her right index finger pain and nodules,  this is likely a calcium deposit, which is seen on x-ray, causing irritation to a nearby branch of the digital ulnar sensory nerve.  Additionally, she does have a moderate sized calcium deposit at the volar aspect of her middle finger PIP joint.  We discussed operative and nonoperative treatments for these conditions.  After thorough explanation of the risks and benefits of a surgical procedure, she did elect to proceed with this.  This procedure would include removal of calcium deposits of the right volar middle finger and right ulnar index finger.  All questions were answered and the patient was  in agreement this plan.  We will see her back on the day of surgery.      Jackson Latino, MD

## 2020-12-14 ENCOUNTER — Telehealth (HOSPITAL_BASED_OUTPATIENT_CLINIC_OR_DEPARTMENT_OTHER): Admitting: Hand Surgery

## 2020-12-14 NOTE — Telephone Encounter (Signed)
 Patient is sending Dr.Cassidy a form for surgery approval via email, patient stated she has Dr.cassidy email and she will do that directly, Thank you

## 2020-12-22 ENCOUNTER — Telehealth (HOSPITAL_BASED_OUTPATIENT_CLINIC_OR_DEPARTMENT_OTHER): Admitting: Hand Surgery

## 2020-12-22 NOTE — Telephone Encounter (Signed)
 Hi,   [employer] called to confirm patients DOS.     They have also sent a fax request for notes for 08/19 note from Alphonzo Severance  [phone] ext [phone]    Thanks

## 2020-12-28 NOTE — Telephone Encounter (Signed)
 Hi,   Patient called about getting notes faxed over to insurance. Fax number is [phone]. Claim number is [id number]. They also need confirmation for surgery.     Patient would like a call once notes are faxed over to confirm.   [phone]    Thanks!

## 2020-12-28 NOTE — Telephone Encounter (Signed)
 Mandy[employer] called to confirm pts date of surgery. Please contact Angelica Chessman can be reached at [phone]. Thank you.

## 2021-01-17 ENCOUNTER — Encounter (HOSPITAL_BASED_OUTPATIENT_CLINIC_OR_DEPARTMENT_OTHER)

## 2021-01-17 ENCOUNTER — Encounter

## 2021-01-17 ENCOUNTER — Ambulatory Visit

## 2021-01-17 ENCOUNTER — Other Ambulatory Visit

## 2021-01-17 NOTE — Anesthesia Pre-Procedure Evaluation (Signed)
 Patient: [name]    Procedure Information     Date/Time: 02/01/21 1235    Procedure: Right Index Finger,Middle Finger Calcinosis (Right: Fingers)    Location: [facility] OR 20 / [facility] Operating [facility]    Surgeons: [name], MD        [name] 35 y.o. female    MEDICATIONS  Allergies   Allergen Reactions   . Cephalexin Shortness of breath and Nausea And Vomiting     Tolerated Cefazolin on 03/09/18 for GYN procedure   . Ciprofloxacin Anaphylaxis, Shortness of breath and Rash     headache   . Dextroamphetamine-Amphetamine Swelling     Patient reports swelling of gums. Symptom dissipated without treatment. Per patient 01/17/21: not allergic to medication, reaction to blue dye in pill   . Prednisone Rash   . Sulfur Headache       Current Medications:     Current Outpatient Medications:   .  icosapent ethyL (Vascepa) 1 gram capsule, Take 1 g by mouth with breakfast and with evening meal., Disp: , Rfl:   .  IMMUN GLOB G-SORB-GLY-IGA 0-50 IV, Infuse into a venous catheter every 14 (fourteen) days. Every 2 weeks infusion, Disp: , Rfl:   .  THYROID ORAL, Take 32 mg by mouth in the morning. Thyroid extract, Disp: , Rfl:   .  gabapentin (Neurontin) 100 mg capsule, Take 1 capsule (100 mg) by mouth in the morning, at noon, and at bedtime. (Patient not taking: Reported on 01/17/2021), Disp: 21 capsule, Rfl: 0  .  multivitamin tablet, Take 1 tablet by mouth in the morning., Disp: , Rfl:     SURGICAL HISTORY  Past Surgical History:   Procedure Laterality Date   . OTHER SURGICAL HISTORY      bone spurs   . OTHER SURGICAL HISTORY  06/2020    excision calcium deposit left middle finger   . RHINOPLASTY          MEDICAL HISTORY AND REVIEW OF SYSTEMS  Relevant Problems   Anesthesia  concern over burning with IV propofol   (+) PONV (postoperative nausea and vomiting)      Cardio  METS > 4      Pulmonary (within normal limits)      Endo   (+) Hypothyroidism due to Hashimoto's thyroiditis (treated)      Hematology (within normal limits)       Neuro/Psych   (+) PTSD (post-traumatic stress disorder)      Circulatory   (+) Difficult intravenous access   (+) Raynaud's disease      NICU Digestive   (+) IBS (irritable bowel syndrome)      Other   (+) Scleroderma (CMS/HCC) (dx by skin biopsy 2007, c/b multiple areas of calcinosis s/p excisions involving elbow, wrists, and multiple fingers)       Functional Status  > 4 mets    COVID Status  Not Vaccinated    Social History  Social History     Tobacco Use   . Smoking status: Never   . Smokeless tobacco: Never   Substance Use Topics   . Alcohol use: Never   . Drug use: Never     Work:    PHYSICAL EXAMINATION  Ht 1.651 m   Wt 58.1 kg   BMI 21.30 kg/m   Physical Exam    Airway  Neck ROM: full     Cardiovascular   Functional capacity: greater than or equal to 4 METS without symptoms   Dental -  normal exam     Pulmonary    Abdominal    General              LABS  No anesthetically relevant labs available.      TESTS AND IMAGING  No anesthetically relevant tests or images available.      ASSESSMENT AND PLAN  [name] 35 y.o. female with h/o corrected hypothyroid, IBS, Raynaud's, scleroderma c/b calcinosis (treated with IVIG), and difficult IV access. Pt presenting for Right Index Finger,Middle Finger Calcinosis. The listed Medical history, Medications, Lab Values, and Testing have been reviewed and addressed and we will proceed with the following plan:       Anesthesia Plan    ASA 3     MAC               Based on available information the patient has been optimized for surgery and does not require further testing    Risk Assessment Scores  Sleep Apnea Risk (MD Calc)  0 (Low Risk of OSA)    30 day perioperative cardiac risk   cardiac event risk by 2011 Gupta (MD Calc)   cardiac event risk by 2019 RCRI (MD Calc)

## 2021-01-30 ENCOUNTER — Telehealth (HOSPITAL_BASED_OUTPATIENT_CLINIC_OR_DEPARTMENT_OTHER): Admitting: Hand Surgery

## 2021-01-30 NOTE — Telephone Encounter (Signed)
 Patient is requesting a call back in regards to scheduling surgery and seeing Dr.Cassidy due to a flare up. Please contact patient. Thank you.

## 2021-02-02 ENCOUNTER — Ambulatory Visit (HOSPITAL_BASED_OUTPATIENT_CLINIC_OR_DEPARTMENT_OTHER): Admitting: Hand Surgery

## 2021-02-02 ENCOUNTER — Ambulatory Visit (HOSPITAL_BASED_OUTPATIENT_CLINIC_OR_DEPARTMENT_OTHER)

## 2021-02-02 ENCOUNTER — Encounter

## 2021-02-19 ENCOUNTER — Encounter (HOSPITAL_BASED_OUTPATIENT_CLINIC_OR_DEPARTMENT_OTHER): Admitting: Rheumatology

## 2021-02-19 ENCOUNTER — Other Ambulatory Visit

## 2021-02-19 ENCOUNTER — Ambulatory Visit: Attending: Rheumatology | Admitting: Rheumatology

## 2021-02-19 ENCOUNTER — Encounter

## 2021-02-19 VITALS — BP 105/72 | HR 85 | Ht 65.0 in | Wt 128.1 lb

## 2021-02-19 DIAGNOSIS — M1990 Unspecified osteoarthritis, unspecified site: Secondary | ICD-10-CM

## 2021-02-19 NOTE — Progress Notes (Signed)
 [facility]  [facility]  [address]  [place] [address]  [place] [address]  Dept: [phone]  Dept Fax: [phone]     Patient ID: [name] is a 35 y.o. female who presents for evaluation of possible arthritis post-lyme infection    Subjective   [name] reports that she had a rash on her calf that looked like a Bull's eye around 4 to 5 cm in dimension (picture provided on her cell-phone) in May 2022 for which she consulted her dermatologist who takes care of her scleroderma and did not feel this to be in favor of lime and was not recommended any treatment, No other symptoms back then  She redeveloped another similar rash in August (unclear whether Bull's eye or erythema migrans from history) with migratory joint pain and swelling of  Knees, and elbows. Her PCP started her on doxycycline and she completed a 28 days of therapy.   She developed left elbow swelling 3 weeks ago  that was not tapped.   No fever or chills,   reports Chronic Diffuse joint stiffness and pain attributed to her dx of scleroderma (established 12 years ago). With an overall increase in the intensity of pain since her presumed lyme infections. Also has increase in myalgias.   Today, she has the baseline stiffness in shoulders, hips mainly (also ankles, knees, elbows, wrists, and small joints) that last 1.5 to 2 hours in the morning but no acute pain that is presented specifically today.   No joint swelling that she noticed   NO fever/chills   No Nausea/vomiting  No unintentional weight loss or night sweats   Re  her sclerdorma  she briefly listed that she is on chronic subcutaneous Ig q1week and follows up with dermatology at [facility]. Who takes care of all her scleroderma aspects (inculding joint involvement, Regulat PFTs, and echo-cardios)   She is concerned whether she has an ongoing lyme disease infection that led to worsening in her joints pain/stiffness +myalgias and is  confused about the lab tests she already underwent and their clinical meanings.     Current Outpatient Medications   Medication Instructions   ? gabapentin (NEURONTIN) 100 mg, oral, 3 times daily   ? icosapent ethyL (VASCEPA) 1 g, oral, 2 times daily with meals   ? IMMUN GLOB G-SORB-GLY-IGA 0-50 IV intravenous, Every 14 days, Every 2 weeks infusion   ? multivitamin tablet 1 tablet, oral, Daily   ? THYROID ORAL 32 mg, oral, Every morning, Thyroid extract     Allergies   Allergen Reactions   ? Cephalexin Shortness of breath and Nausea And Vomiting     Tolerated Cefazolin on 03/09/18 for GYN procedure   ? Ciprofloxacin Anaphylaxis, Shortness of breath and Rash     headache   ? Dextroamphetamine-Amphetamine Swelling     Patient reports swelling of gums. Symptom dissipated without treatment. Per patient 01/17/21: not allergic to medication, reaction to blue dye in pill   ? Prednisone Rash   ? Sulfur Headache     Past Medical History:   Diagnosis Date   ? Hypothyroidism    ? PTSD (post-traumatic stress disorder)      Past Surgical History:   Procedure Laterality Date   ? OTHER SURGICAL HISTORY      bone spurs   ? OTHER SURGICAL HISTORY  06/2020    excision calcium deposit left middle finger   ? RHINOPLASTY  Objective   Visit Vitals  BP 105/72   Pulse 85   Ht 1.651 m   Wt 58.1 kg   SpO2 100%   BMI 21.31 kg/m?   OB Status Having periods   BSA 1.63 m?       Physical Exam  General AAOx3 No acute distress   MSK: Sclerodactyly in both hands, no evident telangiectasias.  No effusion/swelling of elbows or knees.   Lungs: clear to auscultation     35 yo woman with hx of scleroderma (unspecified, dx 12 years ago on SCIG), recurrent lyme infection most recent in AUG 2022, Raynaud's disease and hypothyroidism (Hashimotos) presenting today for evaluation of worsening arthralgias and concerns for lyme disease infection:      Assessment/Plan   Arthritis  [name] has a reported skin rash in May 2022 that looks like Erythema  migrans; uncertain whether this could have been an acute lyme infection or not.   Did have a recurrence in AUG 2022 with worsening arthralgias and a reported left elbow effusion that was not tapped, and received doxy for 28 days empiric treatment for presumed lyme arthritis by her PCP.   Western blot done early November with negative IgG bands, 1 out of 3 positive IgM bands  If [name] were to have an acute lyme infection, she did complete a course of treatment and shows no evidence by serology of an ongoing active infection.  Her myalgias, worsening stiffness and arthralgias could be 2.2 to a presumed acute lyme infection if she really had one   Currently does not show evidence of active lyme infection, and does not warrant any lyme specific treatment.   Also has no evidence of fever, chills weight loss of hemolytic anemia on OP labs done recently, all of which does not warrant testing for co-infections.   Given her hx of scleroderma, worsening arthritis/arthralgias 2/2 to scleroderma flare should be on the differential.  She was recommended to fup with her dermatologist who takes care of her scleroderma regarding her sx.   As well as establishing car with rheumatology within the same system for further evaluation when she has active joint flare up/swelling/       Darel Hong, MD  PGY-2- Internal Medicine

## 2021-03-15 ENCOUNTER — Telehealth (HOSPITAL_BASED_OUTPATIENT_CLINIC_OR_DEPARTMENT_OTHER): Admitting: Hand Surgery

## 2021-03-15 ENCOUNTER — Encounter (HOSPITAL_BASED_OUTPATIENT_CLINIC_OR_DEPARTMENT_OTHER): Admission: RE | Source: Ambulatory Visit

## 2021-03-15 ENCOUNTER — Encounter

## 2021-03-15 ENCOUNTER — Encounter (HOSPITAL_BASED_OUTPATIENT_CLINIC_OR_DEPARTMENT_OTHER)

## 2021-03-15 ENCOUNTER — Ambulatory Visit (HOSPITAL_BASED_OUTPATIENT_CLINIC_OR_DEPARTMENT_OTHER): Admission: RE | Admit: 2021-03-15 | Source: Ambulatory Visit | Admitting: Hand Surgery

## 2021-03-15 SURGERY — Excision, Cyst, Finger
Anesthesia: Monitor Anesthesia Care | Site: Fingers | Laterality: Right

## 2021-03-15 NOTE — Telephone Encounter (Signed)
 Good Afternoon,    Joanna[employer] is requesting pts work status be Fax [phone]. Mardene Celeste can be reached at [phone] Ext [phone]. Thanks.

## 2021-06-06 ENCOUNTER — Encounter

## 2021-06-14 ENCOUNTER — Ambulatory Visit (HOSPITAL_BASED_OUTPATIENT_CLINIC_OR_DEPARTMENT_OTHER): Admission: RE | Admit: 2021-06-14 | Source: Ambulatory Visit | Admitting: Hand Surgery

## 2021-06-14 ENCOUNTER — Encounter (HOSPITAL_BASED_OUTPATIENT_CLINIC_OR_DEPARTMENT_OTHER): Admission: RE | Source: Ambulatory Visit

## 2021-06-14 ENCOUNTER — Encounter

## 2021-06-14 SURGERY — Excision, Cyst, Finger
Anesthesia: Monitor Anesthesia Care | Site: Fingers | Laterality: Right

## 2021-07-10 NOTE — Telephone Encounter (Signed)
Patient is requesting to rescheduled surgery please contact patient. Thank you

## 2021-08-17 NOTE — Progress Notes (Signed)
OT prescription entered.

## 2021-08-17 NOTE — Progress Notes (Signed)
An order was placed for Home VNA services in [facility].    Francine Graven, PA-C

## 2021-08-23 NOTE — Telephone Encounter (Addendum)
Called and spoke with Trish. She had questions regarding the patient's care after surgery, including need for IV antibiotics and VNA services. Patient is scheduled for surgery next week. I told her that I would discuss with hand service to get more details regarding her up coming surgery and expectations for after. Our office will call her back with more information regarding her plan for after discharge.    Misty Stanley, given I am off tomorrow I was wondering if you could discuss with Dr. Rodman Pickle in more detail tomorrow as patient's surgery is scheduled for 6/1. I'm happy to give Rosann Auerbach a call back next Tuesday to discuss more details.     Thank you!

## 2021-08-23 NOTE — Telephone Encounter (Signed)
Hi,   [facility] called about getting details for patients surgery.     Trish  [phone]  Today and tomorrow    Fax: [phone]    Thanks!

## 2021-08-23 NOTE — Telephone Encounter (Signed)
I called Trish back and she is asking questions I'm not able to answer about IV antibiotics.  [phone] trish with [facility]  7:00 3:30 pm

## 2021-08-24 NOTE — Telephone Encounter (Signed)
Hi [name], [name] is scheduled for excision of calcinosis.  She should not require IV antibiotics post-op.  Can you please let the [facility], Trish, know that?    Thanks,  Misty Stanley

## 2021-08-28 NOTE — Telephone Encounter (Signed)
I left a VM for Colleen to call back the office.

## 2021-08-28 NOTE — Telephone Encounter (Signed)
Hi,   Colleen from [facility] called to speak with someone about IV therapy for patient. She would like a call back.     Colleen  [phone]    Thanks!

## 2021-08-30 MED ORDER — oxyCODONE (Roxicodone) 5 mg immediate release tablet
5 | ORAL_TABLET | Freq: Four times a day (QID) | ORAL | 0 refills | 8.00000 days | Status: AC | PRN
Start: 2021-08-30 — End: 2021-09-06

## 2021-08-31 NOTE — Telephone Encounter (Addendum)
Hi,   Patient called because [facility] order was not sent to correct place.     [facility]  [phone]  Fax: [phone]  For Home Aid Visiting Nurse and OT    Thanks!

## 2021-08-31 NOTE — Telephone Encounter (Signed)
Faxed Order to [facility]

## 2021-08-31 NOTE — Telephone Encounter (Signed)
Pt [place] [facility] [place]  [place] not [place] any surgical notes, call dropped before I can get a name please contact [phone]. Thank you.

## 2021-08-31 NOTE — Telephone Encounter (Signed)
[facility] is unable to take the patient for services due to they don't service the patient area please contact if needed [phone] thank you.

## 2021-08-31 NOTE — Telephone Encounter (Signed)
Patient called again upset about the  fax was not received yet by facility .  Patient is interested in home services instead of outpatient services and was hoping someone can refer her to someone.   Referral and surgical notesl needs to be faxed  [facility].   Address updated.   Would like someone to call once completed.

## 2021-09-01 NOTE — Telephone Encounter (Signed)
Patient called this evening with several questions.  Patient stated that the Coban over her surgical site is too tight, she asked if she could lucidness.  We discussed that it is okay to loosen the Coban however she should keep her dressing in place for the previously instructed.  Of time, which is until this Monday.  We also discussed her pain medication regimen.  She has been taking 400 mg of ibuprofen and 5 mg of oxycodone.  She does not feel like this is helping with her pain.  I recommended that she try 800 mg of ibuprofen 3 times daily as needed, and could alternate this with acetaminophen 975.  I suggest that the patient use oxycodone for breakthrough pain if ibuprofen and acetaminophen do not provide relief.  The patient is overall very pleased with this plan.  She denies any numbness tingling.  No infectious symptoms.  No additional complaints.  She will call if she is having more issues.    Augustine Radar, MD  Orthopedic Surgery Resident, PGY3

## 2021-09-03 NOTE — Telephone Encounter (Signed)
[facility] is unable to accept patient due they need a patient to have be seen in a face to face 90 days and also no IV's please contact if needed on [phone] . Thank you

## 2021-09-04 NOTE — Telephone Encounter (Signed)
The [facility] referral is in EPIC from before.  They can determine if she qualifies for services.    Thanks,  Misty Stanley

## 2021-09-04 NOTE — Telephone Encounter (Signed)
Hi,   [facility] called because they are unsure if they should be proceeding with providing services to patient. Patient informed them that she would need services but they were not told that by office. They would like a call back.     Colleen  [phone]    Thanks!

## 2021-09-04 NOTE — Progress Notes (Signed)
 New order placed for [facility] services and OT.    Herbert Deaner, PA-C

## 2021-09-04 NOTE — Telephone Encounter (Signed)
Telephone Encounter    Discussed w/ patient that she should keep her incision clean, dry, and intact until her follow-up appointment with Dr. Rodman Pickle. She should not get the incisions wet, and should keep the sutures in until she is seen in clinic. She will call the clinic tomorrow morning to confirm her appointment time. Patient expressed understanding and was agreeable with the plan. All questions were answered.

## 2021-09-05 NOTE — Telephone Encounter (Signed)
I sent the referral to [facility] yesterday with [facility] svs.

## 2021-09-06 NOTE — Telephone Encounter (Addendum)
Hi,  Primary care - Dr. Renie Ora- called to ask about what antibiotics with surgery. He also is wondering what support he can provide.     [phone]    Thanks!

## 2021-09-10 ENCOUNTER — Ambulatory Visit: Admit: 2021-09-10 | Discharge: 2021-09-10 | Payer: BLUE CROSS/BLUE SHIELD | Attending: Hand Surgery

## 2021-09-10 DIAGNOSIS — M349 Systemic sclerosis, unspecified: Secondary | ICD-10-CM

## 2021-09-10 NOTE — Progress Notes (Addendum)
 [facility] Note    Patient Name: [name]  MRN: [id number]    Chief Complaint: Status post right index, middle and ring finger calcinosis excision, 08/30/2021    History of Present Illness:  Follow-up.  The patient is a 36 year old female with scleroderma who is now 11 days status post the above procedures.  She is doing well.  She has no new complaints today.  Her paperwork has been filled out appropriately and she has received the appropriate home care that was requested.  She notes that her recovery has been smooth.    Allergies:  Allergies   Allergen Reactions   . Cephalexin Shortness of breath and Nausea And Vomiting     Tolerated Cefazolin on 03/09/18 for GYN procedure   . Ciprofloxacin Anaphylaxis, Shortness of breath and Rash     headache   . Dextroamphetamine-Amphetamine Swelling     Patient reports swelling of gums. Symptom dissipated without treatment. Per patient 01/17/21: not allergic to medication, reaction to blue dye in pill   . Prednisone Rash   . Sulfur Headache       Medical History:  Past Medical History:   Diagnosis Date   . Hypothyroidism    . PTSD (post-traumatic stress disorder)        Surgical History:  Past Surgical History:   Procedure Laterality Date   . OTHER SURGICAL HISTORY      bone spurs   . OTHER SURGICAL HISTORY  06/2020    excision calcium deposit left middle finger   . RHINOPLASTY         Current Medications:  Current Outpatient Medications   Medication Sig Dispense Refill   . icosapent ethyL (Vascepa) 1 gram capsule Take 1 g by mouth with breakfast and with evening meal.     . IMMUN GLOB G-SORB-GLY-IGA 0-50 IV Infuse into a venous catheter every 14 (fourteen) days. Every 2 weeks infusion     . multivitamin tablet Take 1 tablet by mouth in the morning.     . THYROID ORAL Take 32 mg by mouth in the morning. Thyroid extract     . gabapentin (Neurontin) 100 mg capsule Take 1 capsule (100 mg) by mouth in the morning, at noon, and at bedtime. (Patient not taking:  Reported on 01/17/2021) 21 capsule 0     No current facility-administered medications for this visit.       Social history:  Tobacco Use: Low Risk  (09/10/2021)    Patient History    . Smoking Tobacco Use: Never    . Smokeless Tobacco Use: Never    . Passive Exposure: Not on file     Alcohol Use: Not on file       Physical Exam  On examination today she is a well-appearing, pleasant female in no apparent distress.  On examination of her right hand the surgical incisions are all healing well without erythema, drainage or evidence of infection.    Imaging:       Problem List Items Addressed This Visit    None       Assessment/Plan:  The patient was seen today with Dr. Rodman Pickle.  Incisions are healing very well.  Sutures were removed today and Steri-Strips placed.  She may continue with range of motion and activities as tolerated.  She may follow-up on an as-needed basis. Jeri Modena MD, performed my own history, physical examination, and review of relevant studies. I reviewed the nature of the condition with the patient,  as well as relevant studies, and performed the medical decision making, explaining treatment options to the patient.       Daphine Deutscher, PA

## 2021-10-05 NOTE — Telephone Encounter (Signed)
Good morning, Pt reached out and is stating she had surgery 06/01 and is having nerve pain and would like a nurse to reach out     Best number [phone]     Thank you

## 2021-10-05 NOTE — Telephone Encounter (Signed)
I called and spoke with the patient.  She is having some pain in the finger.  She describes it as "nerve type" pain.  The incision has healed.  There is no drainage.  She notes persistent discoloration of the finger but no concerns for infection.  I recommend that she give this a little more time.  She was encouraged to continue with occupational therapy as tolerated.  She was advised to contact the office if she has any worsening pain, redness, drainage or other signs and symptoms of infection.  She is also wondering if Dr. Rodman Pickleassidy has any further recommendations after speaking with colleagues.  I let her know that I would speak with him.    Herbert DeanerLisa Lyal Husted, PA-C

## 2021-10-10 NOTE — Telephone Encounter (Signed)
Pt called to schedule an appt but there is nothing until August. Patient states one of her fingers is turning red, and she had surgery last month. States this cannot wait until August

## 2021-10-15 ENCOUNTER — Ambulatory Visit: Payer: BLUE CROSS/BLUE SHIELD | Attending: Hand Surgery

## 2021-10-19 ENCOUNTER — Ambulatory Visit: Admit: 2021-10-19 | Discharge: 2021-10-19 | Payer: BLUE CROSS/BLUE SHIELD | Attending: Hand Surgery

## 2021-10-19 NOTE — Progress Notes (Addendum)
[facility] Note    Patient Name: [name]  MRN: [id number]    Chief Complaint: Status post right index, middle and ring finger calcinosis excision, 08/30/2021    History of Present Illness:  Follow-up.  The patient is a 36 year old female with scleroderma who is now over 6 weeks status post the above procedures.  She has been paying out-of-pocket for OT as her insurance only pays for 1 visit per week.  She is having a lot of nerve symptoms in her middle finger.  They have been doing desensitization however she does not feel that it is improving.  She is wondering about the timeline of recovery.  She is on gabapentin.  She also has a painful left small finger contracture for which she is interested in a Metallurgist.  She had this in the past for the same finger.    Allergies:  Allergies   Allergen Reactions   . Cephalexin Shortness of breath and Nausea And Vomiting     Tolerated Cefazolin on 03/09/18 for GYN procedure   . Ciprofloxacin Anaphylaxis, Shortness of breath and Rash     headache   . Dextroamphetamine-Amphetamine Swelling     Patient reports swelling of gums. Symptom dissipated without treatment. Per patient 01/17/21: not allergic to medication, reaction to blue dye in pill   . Prednisone Rash   . Sulfur Headache       Medical History:  Past Medical History:   Diagnosis Date   . Hypothyroidism    . PTSD (post-traumatic stress disorder)        Surgical History:  Past Surgical History:   Procedure Laterality Date   . OTHER SURGICAL HISTORY      bone spurs   . OTHER SURGICAL HISTORY  06/2020    excision calcium deposit left middle finger   . RHINOPLASTY         Current Medications:  Current Outpatient Medications   Medication Sig Dispense Refill   . gabapentin (Neurontin) 100 mg capsule Take 1 capsule (100 mg) by mouth in the morning, at noon, and at bedtime. (Patient not taking: Reported on 01/17/2021) 21 capsule 0   . icosapent ethyL (Vascepa) 1 gram capsule Take 1 g by mouth with  breakfast and with evening meal.     . IMMUN GLOB G-SORB-GLY-IGA 0-50 IV Infuse into a venous catheter every 14 (fourteen) days. Every 2 weeks infusion     . multivitamin tablet Take 1 tablet by mouth in the morning.     . THYROID ORAL Take 32 mg by mouth in the morning. Thyroid extract       No current facility-administered medications for this visit.       Social history:  Tobacco Use: Low Risk  (10/19/2021)    Patient History    . Smoking Tobacco Use: Never    . Smokeless Tobacco Use: Never    . Passive Exposure: Not on file     Alcohol Use: Not on file       Physical Exam  On examination today she is a well-appearing female in no apparent distress.  On examination of her right hand the skin is well-healed.  Surgical incisions are well-healed.  There is no evidence of infection.  On examination of her left small finger she has a greater than 90 degree  flexion contracture actively at the PIP joint, passively she extends to about -75--80 degrees.    Imaging:       Problem List Items  Addressed This Visit    None       Assessment/Plan:  The patient was seen today with Dr. Rodman Pickle.  She was encouraged to continue with desensitization and was provided with a new prescription for OT today.  This should improve with time although it is difficult to predict the timeline.  She is planning on seeing pain management for stellate ganglion blocks to assist with her Raynaud's.  She would like to pursue her care here and we have put in a referral for Dr. Iran Sizer.  Options for her left small finger were again discussed.  If she were to pursue a digit widget she would also need to have a procedure to address her extensor mechanism.  If she obtains extension she will likely lose flexion.  She will not be able to regain both.  The other more predictable option would be a fusion of her left small finger PIP joint.  She would like to think about this and will either follow-up or contact the office when she has made a decision.  Jeri Modena MD, performed my own history, physical examination, and review of relevant studies. I reviewed the nature of the condition with the patient, as well as relevant studies, and performed the medical decision making, explaining treatment options to the patient.     Daphine Deutscher, PA

## 2021-10-24 NOTE — Telephone Encounter (Signed)
Hi,   [facility] called about getting prescription faxed over.     [phone]  Fax: [phone]    Thanks!

## 2021-10-24 NOTE — Telephone Encounter (Signed)
I faxed the script over to [phone]

## 2021-11-22 NOTE — Telephone Encounter (Signed)
Reviewed the following instructions for:   NP CONSULT    1. Please arrive 15 minutes early to check in/register on the first floor of the [facility] Building, [address]. Take elevators to the 5th floor and wait in the waiting room.

## 2021-11-23 ENCOUNTER — Institutional Professional Consult (permissible substitution): Admit: 2021-11-23 | Discharge: 2021-11-23 | Payer: BLUE CROSS/BLUE SHIELD | Attending: Anesthesiology

## 2021-11-23 DIAGNOSIS — F320 Major depressive disorder, single episode, mild: Secondary | ICD-10-CM

## 2021-11-23 NOTE — Patient Instructions (Signed)
Counseling Resource Finder:  [email or web address]

## 2021-11-23 NOTE — Progress Notes (Signed)
[name]  [address]  [place] [address]  Dept: [phone]       PC: [name] is a 36 y.o. female who presents for NEW PATIENT - SCLERODERMA, RAYNAUD'S DISEASE WITHOUT GANGRENE - LISA GORDON, PA.    HPC: The patient is a very pleasant 36 y.o. year old female who presents to our Pain Clinic with nerve pain to her right middle and index finger due to Raynaud's Phenomenon. She is status post right index, middle and ring finger calcinosis excision, 08/30/2021.  She has had this pain for years.     The pain is located to her right middle and index fingers.  She does state that her "Hand and finger pain is complicated." The patient describes the character of the pain as burning.  On presentation today, she rates the intensity as 7/10. At its worst it can be up to  7/10.     The pain is exacerbated by cold and touch. Anything touching the fingers can increase the pain.  It is alleviated by nothing.  In the past, she has tried gabapentin.  She is not currently taking gabapentin, stating that, "takes it for a while after a surgery."  The patient has completed 6 weeks of occupational therapy and continues to have pain.     The pain is present about 100% of the time. We discussed the daily pattern of the pain and the she states that there is no pattern to her pain.     In terms of function, the pain is interfering with activities of daily living         Current Outpatient Medications   Medication Instructions   . gabapentin (NEURONTIN) 100 mg, oral, 3 times daily   . icosapent ethyL (VASCEPA) 1 g, oral, 2 times daily with meals   . IMMUN GLOB G-SORB-GLY-IGA 0-50 IV intravenous, Every 14 days, Every 2 weeks infusion   . multivitamin tablet 1 tablet, oral, Daily   . THYROID ORAL 32 mg, oral, Every morning, Thyroid extract     Allergies   Allergen Reactions   . Cephalexin Shortness of breath and Nausea And Vomiting     Tolerated Cefazolin on 03/09/18 for GYN procedure   . Ciprofloxacin  Anaphylaxis, Shortness of breath and Rash     headache   . Dextroamphetamine-Amphetamine Swelling     Patient reports swelling of gums. Symptom dissipated without treatment. Per patient 01/17/21: not allergic to medication, reaction to blue dye in pill   . Prednisone Rash   . Sulfur Headache     Past Medical History:   Diagnosis Date   . Hypothyroidism    . PTSD (post-traumatic stress disorder)      Past Surgical History:   Procedure Laterality Date   . HAND SURGERY Right 08/30/2021   . OTHER SURGICAL HISTORY      bone spurs   . OTHER SURGICAL HISTORY  06/2020    excision calcium deposit left middle finger   . RHINOPLASTY       Social History     Tobacco Use   . Smoking status: Never   . Smokeless tobacco: Never   Substance Use Topics   . Alcohol use: Never   . Drug use: Never       Objective     Visit Vitals  BP (!) 87/53 (BP Location: Right arm, Patient Position: Sitting, BP Cuff Size: Adult)   Pulse 74   Temp 36.6 C (97.9 F) (Temporal)  Ht 1.651 m   Wt 56.7 kg   BMI 20.80 kg/m   OB Status Having periods   BSA 1.61 m       Physical Exam  Vitals and nursing note reviewed.   Constitutional:       General: She is not in acute distress.     Appearance: Normal appearance.   HENT:      Head: Normocephalic.   Neurological:      Mental Status: She is alert and oriented to person, place, and time.   Psychiatric:         Attention and Perception: Attention and perception normal.         Mood and Affect: Mood and affect normal.         Speech: Speech normal.         Cognition and Memory: Cognition normal.         Procedures    Imaging:  No new or relevant imaging studies        Aerika [name] is a 36 y.o. female who presents for reynauds disease.    Assessment/Plan   Scleroderma (CMS/HCC)  -     Ambulatory referral to Pain Medicine  Raynaud's disease without gangrene  -     Ambulatory referral to Pain Medicine      1. The patient will continue conservative therapies including heat, ice, stretching, NSAIDs and periodic  rest.   2. Left stellate block in 4-6 weeks. We will use the ultrasound from the OR for this procedure. The patient will be coming from [place].   3. Consider RFA if needed           Attestation    This visit was conducted in person.        A total of 35 minutes was spent during, and preparing for, this encounter including, where appropriate: review of imaging, performing medically appropriate examination, counseling/education regarding treatment plans/injections and clinical documentation into the electronic health record.    Thank you for letting me participate in this patient's care.         Patient Health Questionnaire-9 Score: 6 Interpretation: Positive screening.      Follow-up & Interventions: patient has left the office, review at next visit

## 2021-12-21 ENCOUNTER — Ambulatory Visit: Payer: BLUE CROSS/BLUE SHIELD | Attending: Anesthesiology

## 2021-12-27 ENCOUNTER — Ambulatory Visit: Payer: BLUE CROSS/BLUE SHIELD | Attending: Anesthesiology

## 2021-12-27 NOTE — Progress Notes (Deleted)
[facility]  [address]  [place] [address]  Dept: [phone]         [name] is a 36 y.o. female who presents for LEFT STELLATE BLOCK.    Assessment/Plan   There are no diagnoses linked to this encounter.    1. Procedure today, as detailed below.  2. We will follow up with the patient in 2-4 weeks.  3. ***           Procedures

## 2022-01-10 NOTE — Telephone Encounter (Signed)
Workers comp is requesting call back in regards to patient disability claim and restricstions please contact Darrol Angelllison Johnson [phone].Thank you

## 2022-02-15 ENCOUNTER — Ambulatory Visit: Payer: BLUE CROSS/BLUE SHIELD | Attending: Anesthesiology

## 2022-02-15 NOTE — Patient Instructions (Incomplete)
See Stellate Ganglion Handout

## 2022-02-15 NOTE — Progress Notes (Deleted)
[facility]  [address]  [place] [address]  Dept: [phone]         Sephira Zellman is a 36 y.o. female who presents for Left STELLATE GANGLION BLOCK.    Assessment/Plan   There are no diagnoses linked to this encounter.    1. Procedure today, as detailed below.  2. We will follow up with the patient in 2-4 weeks.  3. ***           Procedures    Physical Exam

## 2022-08-19 ENCOUNTER — Ambulatory Visit: Payer: BLUE CROSS/BLUE SHIELD

## 2023-06-17 IMAGING — MR MRI RIGHT SHOULDER WITHOUT CONTRAST
4 of 6 series · 28 of 40 positions shown · IV contrast (gadolinium)
Comparison: None

________________________________________________________________________________________________ 
MRI RIGHT SHOULDER WITHOUT CONTRAST, 06/17/2023 [DATE]: 
CLINICAL INDICATION: Pain In Right Shoulder
TECHNIQUE: Multiplanar, multiecho position MR images of the shoulder were 
performed without intravenous gadolinium enhancement.

[Series 201: survey right · axial · right · 10.0mm · 0.71mm/px · z∈[-40,+125]mm · 5 of 15 slices shown]
[im 1/15]
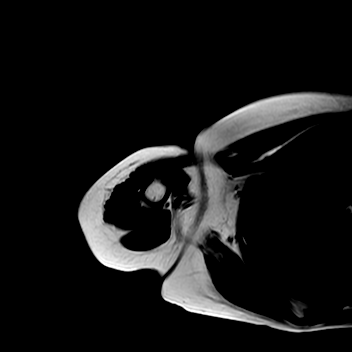
[im 4/15]
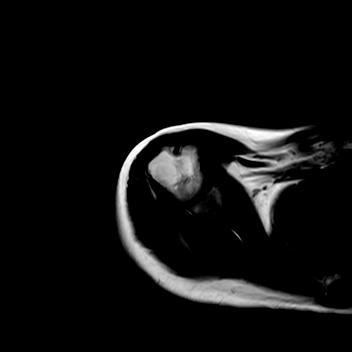
[im 8/15]
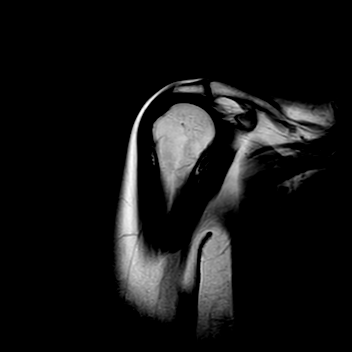
[im 11/15]
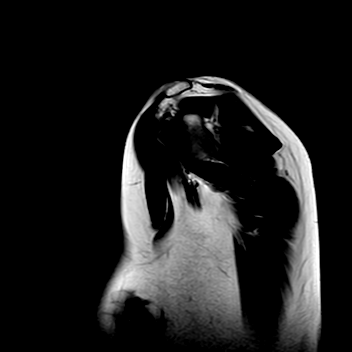
[im 15/15]
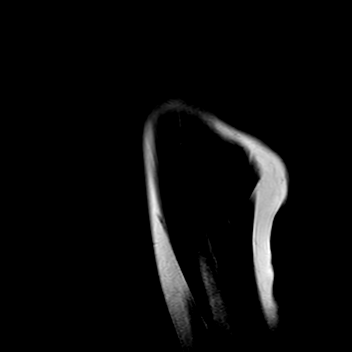

[Series 301: (person_name)_(person_name)_(person_name) right · axial · right · 3.5mm · 0.39mm/px · z∈[-34,+71]mm · 9 of 28 slices shown]
[im 1/28]
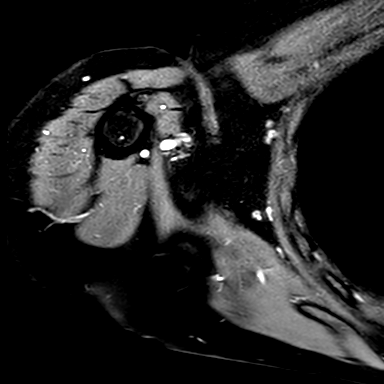
[im 4/28]
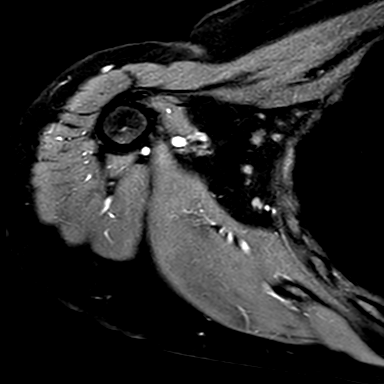
[im 7/28]
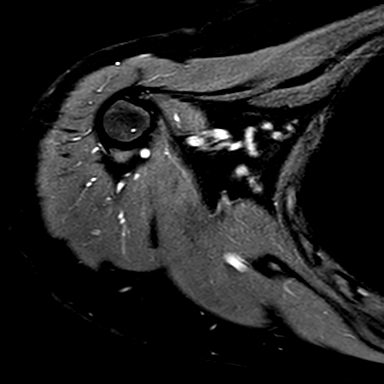
[im 11/28]
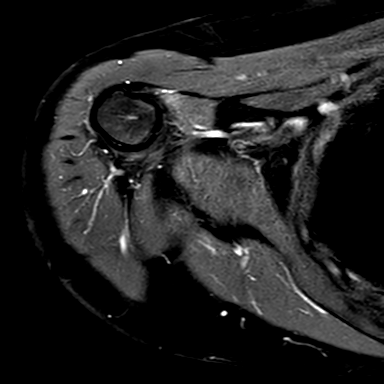
[im 14/28]
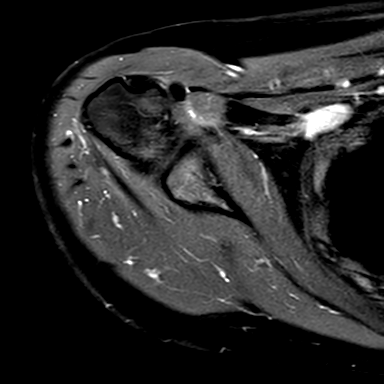
[im 17/28]
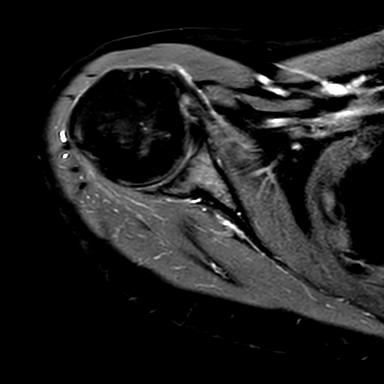
[im 21/28]
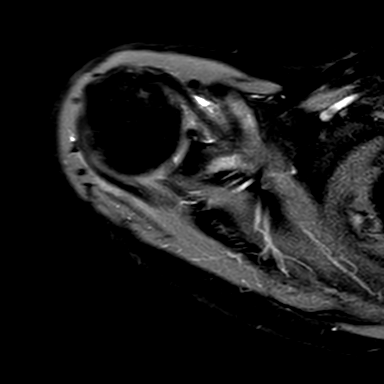
[im 24/28]
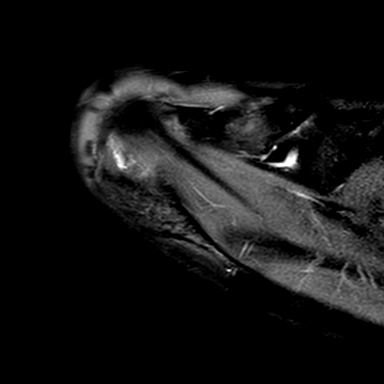
[im 28/28]
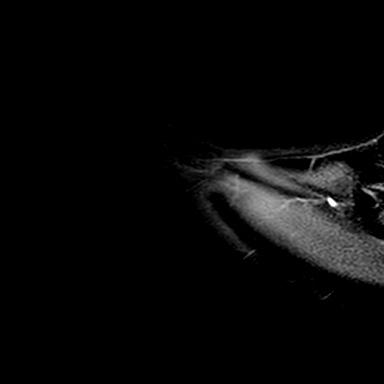

[Series 401: t2_fs_sag right · oblique · right · 3.5mm · 0.35mm/px · 7 of 20 slices shown]
[im 1/20]
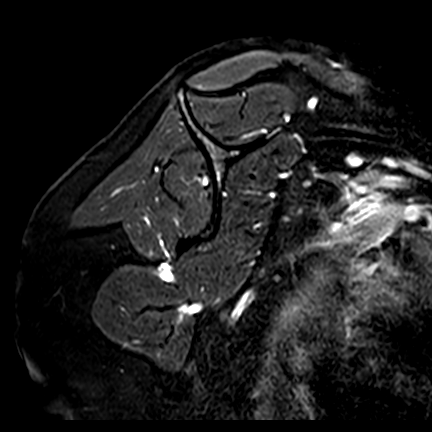
[im 4/20]
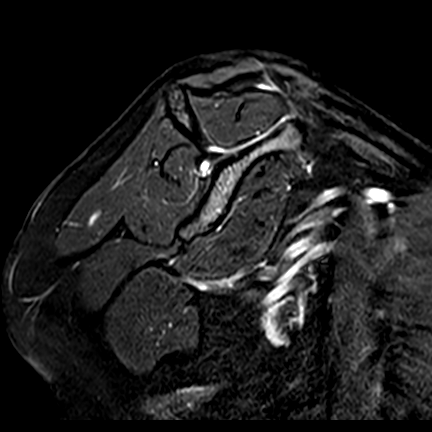
[im 7/20]
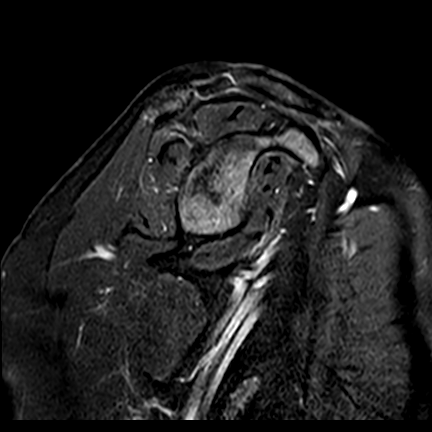
[im 10/20]
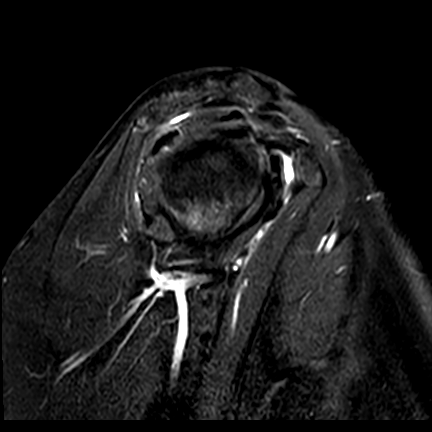
[im 13/20]
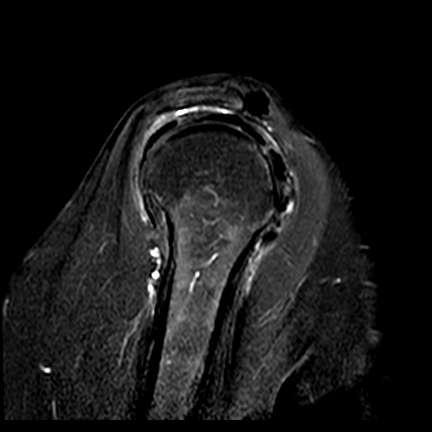
[im 16/20]
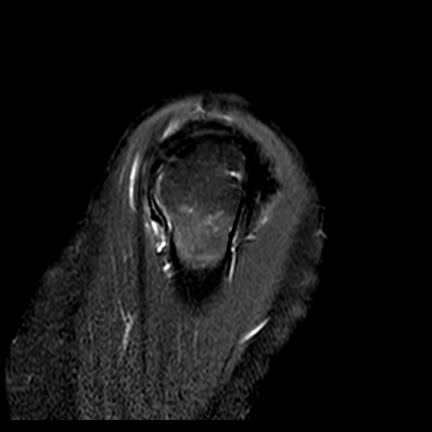
[im 20/20]
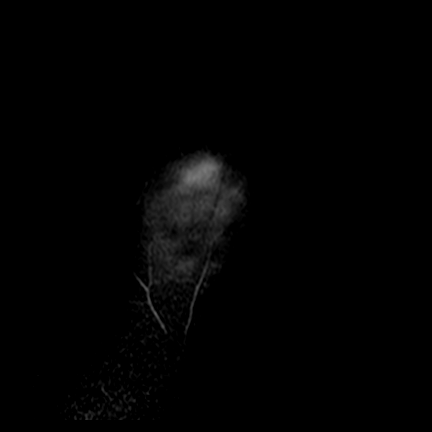

[Series 501: t1_sag right · oblique · right · 3.5mm · 0.31mm/px · 7 of 20 slices shown]
[im 1/20]
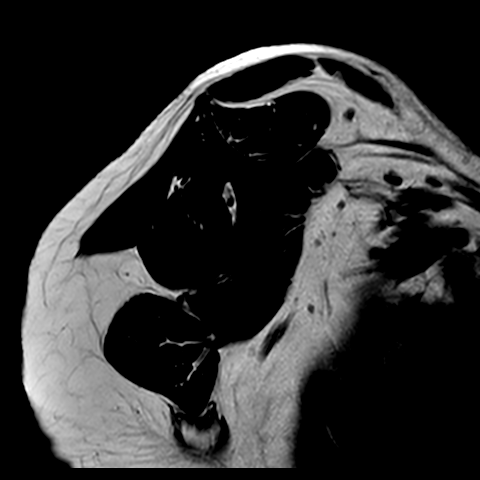
[im 4/20]
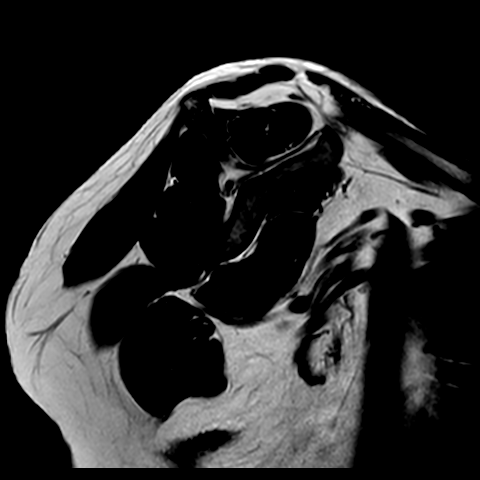
[im 7/20]
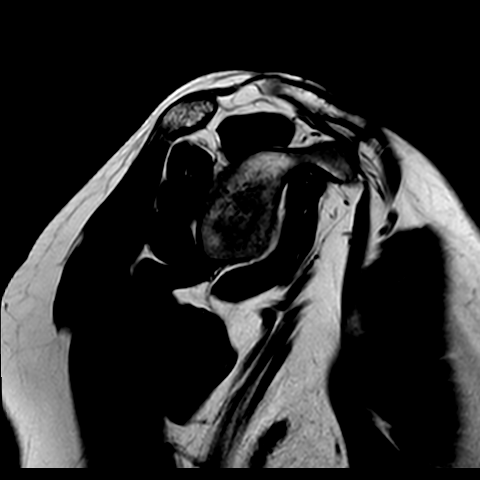
[im 10/20]
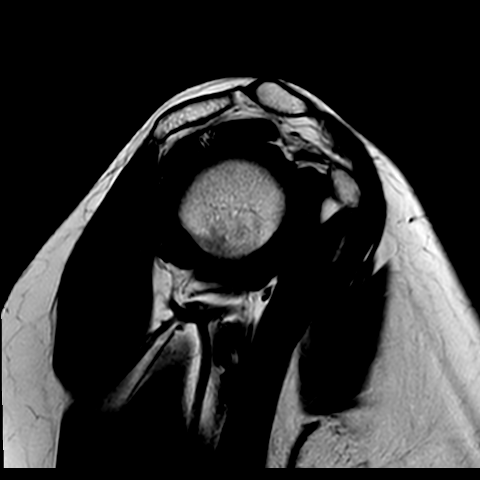
[im 13/20]
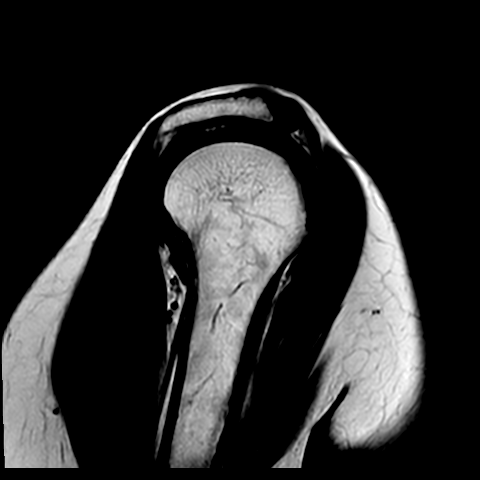
[im 16/20]
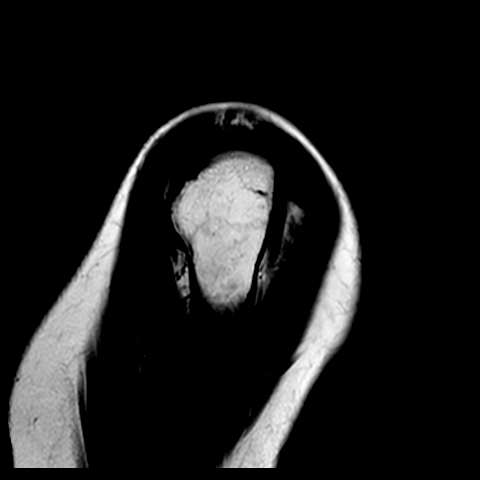
[im 20/20]
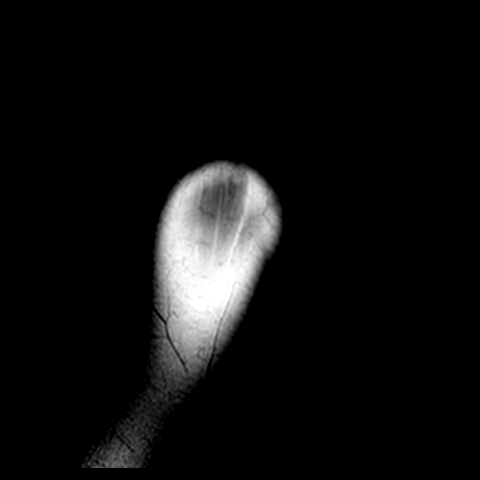

[28 of 40 positions shown; findings below may reference images not displayed]

FINDINGS: ROTATOR CUFF: There is signal abnormality and thickening with tendinosis of the 
subscapularis. There is adjacent globular decreased signal intensity anterior to 
the subscapularis measuring 8 mm compatible with hydroxyapatite deposition. 
There is additional 8 mm area of hydroxyapatite anterior to the AC joint. The 
rotator cuff musculature is symmetric without mass, signal abnormality or 
atrophy. 
ACROMIOCLAVICULAR JOINT: Preserved. The coracoacromial ligament is intact 
without prominent spurring at the acromial attachment. The acromioclavicular and 
coracoclavicular ligaments are preserved. The acromium is normal in morphology. 
GLENOHUMERAL JOINT: The humeral head is well located within the glenoid fossa. 
Articular cartilage is preserved.  The glenoid labrum is preserved. No 
paralabral cyst. The intra-articular portion of the long head of the biceps 
tendon is negative. No shoulder joint effusion. 
BONES: The bone marrow signal intensity is negative for fracture. No Hill-Sachs 
defect.  
ADDITIONAL FINDINGS: There is trace fluid within the subacromial subdeltoid 
space. The axillary region is negative. Subcutaneous tissues are negative.
IMPRESSION: 1.  Rotator cuff calcific tendinosis with hydroxyapatite deposition. 
2.  Trace subacromial-subdeltoid bursitis.

## 2023-06-25 NOTE — Telephone Encounter (Signed)
 Patient requests a call back with CPT codes for follow up appointment, calcific tendonitis of the shoulder, for hand calcium removal and for repeat surgery for the left elbow (Dr. Rodman Pickle previously performed this surgery.)    Best call back: [phone]

## 2023-07-21 ENCOUNTER — Ambulatory Visit: Payer: BLUE CROSS/BLUE SHIELD | Attending: Hand Surgery

## 2023-08-05 NOTE — Telephone Encounter (Signed)
 Patient is scheduled to have an appointment on 08/11/23 and patient wants to know if appointment can be extended longer then 15 minutes. Patient stated that provider has been their provider for the past 10 years and patient is flying in for this appointment, and can't keep paying for flights.        Best call back number for patient: [phone]      Thank you

## 2023-08-07 ENCOUNTER — Inpatient Hospital Stay: Admit: 2023-08-07 | Payer: BLUE CROSS/BLUE SHIELD

## 2023-08-07 ENCOUNTER — Ambulatory Visit: Admit: 2023-08-07 | Payer: BLUE CROSS/BLUE SHIELD | Attending: Hand Surgery

## 2023-08-07 DIAGNOSIS — M79644 Pain in right finger(s): Secondary | ICD-10-CM

## 2023-08-07 NOTE — Progress Notes (Signed)
 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  Her examination demonstrated that the ulnar nerve appeared to be encased in the calcium deposits.  She is recovering well from her right hand surgery and would like to proceed with excision of the heterotopic bone and ulnar nerve decompression with possible transposition.  Risks include not limited to infection, nerve damage, stiffness, recurrence, incomplete symptom of explained to the patient preoperatively who wishes to proceed.

## 2023-08-07 NOTE — H&P (View-Only) (Signed)
 [facility] Note    Patient Name: [name]  MRN: [id number]    Chief Complaint: 1.  Right finger pain  2.  Right shoulder pain  3.  Left elbow pain    History of Present Illness:  The patient is a 38 year old female who presents today for evaluation of right finger pain.  The patient underwent right ring finger calcinosis excision 1 week ago at an outside hospital.  She has had this procedure with Dr. Dorisann [name] in the past as well.  She was feeling well the first few days after the surgery.  However 3 days ago when she was removing her dressing, she noticed purulent drainage.  She did see her original surgeon this morning who removed the stitches, there was drainage with suture removal.  She does not report any fevers or chills.  She has been using topical antibiotics over the area.  With regards to her right shoulder, the patient reports many months of superior shoulder pain.  Symptoms are worse with overhead reaching and playing pickle ball.  She reports she has had to stop playing pickle ball secondary to the symptoms.  She has been going to physical therapy which does not improve her pain.  No prior injections.  She also complains of pain in her left elbow.  She has had prior surgery for her left elbow with Dr. Dorisann [name].  She reports pain is impinging on her nerve and she is getting numbness and tingling in her ring and small finger.    Allergies:  Allergies[1]    Medical History:  Medical History[2]    Surgical History:  Surgical History[3]    Current Medications:  Current Medications[4]    Social history:  Tobacco Use: Low Risk  (11/22/2021)    Patient History     Smoking Tobacco Use: Never     Smokeless Tobacco Use: Never     Passive Exposure: Not on file     Alcohol Use: Not At Risk (06/01/2019)    Received from [facility]    AUDIT-C     Frequency of Alcohol Consumption: Never     Average Number of Drinks: Not asked     Frequency of Binge Drinking: Never       Review of Systems:  10  point review of systems noncontributory except as above in HPI    Physical Exam  On examination, the patient is a pleasant well-appearing 38 year old female in no apparent distress.  On examination of the right finger, purulent pocket at the surgical incision.  No surrounding erythema.  No expressible drainage.  Able to make a loose flexion composite fist.  No tenderness to palpation.  She does have visible and palpable calcium deposit at the radial aspect of the DIP of her small finger.  On examination of the right shoulder, skin is clean and intact.  Palpable calcium deposit surrounding the AC joint.  Forward elevation 170, external rotation 70.  Mild discomfort with range of motion.  On examination of the left elbow, well-healed surgical incision.  Well-maintained range of motion.  No tenderness to palpation.  The upper extremities warm and well-perfused.  Intact neurologic sensation.    Imaging: Radiographs of the right ring finger were obtained and reviewed today, these demonstrate no acute bony abnormalities.  Evidence of calcium deposits at the ulnar aspect of the DIP joint.    Outside radiographs of the right shoulder were reviewed today, these demonstrate calcium deposit at the [facility] joint.  Problem List  Items Addressed This Visit          Other    Scleroderma (Multi-HCC) (Chronic)    Relevant Orders    CT SHOULDER RIGHT WO CONTRAST    Calcinosis    Relevant Orders    CT SHOULDER RIGHT WO CONTRAST     Other Visit Diagnoses         Finger pain, right    -  Primary    Relevant Orders    XR FINGERS RIGHT 2+ VIEWS (Completed)      Acute pain of right shoulder        Relevant Orders    CT SHOULDER RIGHT WO CONTRAST             Assessment/Plan:  The patient was seen and evaluated with Dr. Dorisann [name].  All findings were reviewed with the patient today.  Clinical presentation consistent with calcium deposits.  With regards to her finger, we discussed that it does not appear infected today.  We believe the drainage is  secondary to calcium continuing to drain out of the wound.  We discussed the natural history and etiology of this.  She would like to proceed with repeat excision as well as excision of the lesion in her small finger.  With regards to her shoulder, we did discuss operative management as well.  The palpable calcium deposit appears to be related to the [facility] joint.  However, she has additional calcific deposits.  The risks, expectations, recovery of operative management were discussed with her.  We placed an order for a CT scan to aid in preoperative planning.  The patient understands and would like to proceed with surgery.  Surgery would include right ring and small finger calcinosis excision with right shoulder calcinosis excision.  Risks of surgery including not limited to infection, nerve damage, stiffness, recurrence, incomplete symptom relief, wound healing problems were reviewed.  She wishes to proceed.  We will contact the patient to schedule a surgical date.  All questions were answered.  The patient is in agreement.  [name] Malter ANN Halcyon Heck, PA         [1]   Allergies  Allergen Reactions    Cephalexin Shortness of breath and Nausea And Vomiting     Tolerated Cefazolin on 03/09/18 for GYN procedure    Ciprofloxacin Anaphylaxis, Shortness of breath and Rash     headache    Dextroamphetamine-Amphetamine Swelling     Patient reports swelling of gums. Symptom dissipated without treatment. Per patient 01/17/21: not allergic to medication, reaction to blue dye in pill    Prednisone Rash    Sulfur Headache   [2]   Past Medical History:  Diagnosis Date    Hypothyroidism     PTSD (post-traumatic stress disorder)    [3]   Past Surgical History:  Procedure Laterality Date    HAND SURGERY Right 08/30/2021    OTHER SURGICAL HISTORY      bone spurs    OTHER SURGICAL HISTORY  06/2020    excision calcium deposit left middle finger    RHINOPLASTY     [4]   Current Outpatient Medications   Medication Sig Dispense Refill    gabapentin  (Neurontin) 100 mg capsule Take 1 capsule (100 mg) by mouth in the morning, at noon, and at bedtime. 21 capsule 0    icosapent ethyL (Vascepa) 1 gram capsule Take 1 g by mouth with breakfast and with evening meal.      IMMUN GLOB G-SORB-GLY-IGA 0-50 IV Infuse into  a venous catheter every 14 (fourteen) days. Every 2 weeks infusion      multivitamin tablet Take 1 tablet by mouth in the morning.      THYROID ORAL Take 32 mg by mouth in the morning. Thyroid extract       No current facility-administered medications for this visit.

## 2023-08-07 NOTE — Progress Notes (Addendum)
 [facility] Note    Patient Name: [name]  MRN: [id number]    Chief Complaint: 1.  Right finger pain  2.  Right shoulder pain  3.  Left elbow pain    History of Present Illness:  The patient is a 38 year old female who presents today for evaluation of right finger pain.  The patient underwent right ring finger calcinosis excision 1 week ago at an outside hospital.  She has had this procedure with Dr. Dorisann [name] in the past as well.  She was feeling well the first few days after the surgery.  However 3 days ago when she was removing her dressing, she noticed purulent drainage.  She did see her original surgeon this morning who removed the stitches, there was drainage with suture removal.  She does not report any fevers or chills.  She has been using topical antibiotics over the area.  With regards to her right shoulder, the patient reports many months of superior shoulder pain.  Symptoms are worse with overhead reaching and playing pickle ball.  She reports she has had to stop playing pickle ball secondary to the symptoms.  She has been going to physical therapy which does not improve her pain.  No prior injections.  She also complains of pain in her left elbow.  She has had prior surgery for her left elbow with Dr. Dorisann [name].  She reports pain is impinging on her nerve and she is getting numbness and tingling in her ring and small finger.    Allergies:  Allergies[1]    Medical History:  Medical History[2]    Surgical History:  Surgical History[3]    Current Medications:  Current Medications[4]    Social history:  Tobacco Use: Low Risk  (11/22/2021)    Patient History     Smoking Tobacco Use: Never     Smokeless Tobacco Use: Never     Passive Exposure: Not on file     Alcohol Use: Not At Risk (06/01/2019)    Received from [facility]    AUDIT-C     Frequency of Alcohol Consumption: Never     Average Number of Drinks: Not asked     Frequency of Binge Drinking: Never       Review of Systems:  10  point review of systems noncontributory except as above in HPI    Physical Exam  On examination, the patient is a pleasant well-appearing 38 year old female in no apparent distress.  On examination of the right finger, purulent pocket at the surgical incision.  No surrounding erythema.  No expressible drainage.  Able to make a loose flexion composite fist.  No tenderness to palpation.  She does have visible and palpable calcium deposit at the radial aspect of the DIP of her small finger.  On examination of the right shoulder, skin is clean and intact.  Palpable calcium deposit surrounding the AC joint.  Forward elevation 170, external rotation 70.  Mild discomfort with range of motion.  On examination of the left elbow, well-healed surgical incision.  Well-maintained range of motion.  No tenderness to palpation.  The upper extremities warm and well-perfused.  Intact neurologic sensation.    Imaging: Radiographs of the right ring finger were obtained and reviewed today, these demonstrate no acute bony abnormalities.  Evidence of calcium deposits at the ulnar aspect of the DIP joint.    Outside radiographs of the right shoulder were reviewed today, these demonstrate calcium deposit at the [facility] joint.  Problem List  Items Addressed This Visit          Other    Scleroderma (Multi-HCC) (Chronic)    Relevant Orders    CT SHOULDER RIGHT WO CONTRAST    Calcinosis    Relevant Orders    CT SHOULDER RIGHT WO CONTRAST     Other Visit Diagnoses         Finger pain, right    -  Primary    Relevant Orders    XR FINGERS RIGHT 2+ VIEWS (Completed)      Acute pain of right shoulder        Relevant Orders    CT SHOULDER RIGHT WO CONTRAST             Assessment/Plan:  The patient was seen and evaluated with Dr. Dorisann [name].  All findings were reviewed with the patient today.  Clinical presentation consistent with calcium deposits.  With regards to her finger, we discussed that it does not appear infected today.  We believe the drainage is  secondary to calcium continuing to drain out of the wound.  We discussed the natural history and etiology of this.  She would like to proceed with repeat excision as well as excision of the lesion in her small finger.  With regards to her shoulder, we did discuss operative management as well.  The palpable calcium deposit appears to be related to the [facility] joint.  However, she has additional calcific deposits.  The risks, expectations, recovery of operative management were discussed with her.  We placed an order for a CT scan to aid in preoperative planning.  The patient understands and would like to proceed with surgery.  Surgery would include right ring and small finger calcinosis excision with right shoulder calcinosis excision.  Risks of surgery including not limited to infection, nerve damage, stiffness, recurrence, incomplete symptom relief, wound healing problems were reviewed.  She wishes to proceed.  We will contact the patient to schedule a surgical date.  All questions were answered.  The patient is in agreement.  [name] ANN Halcyon Heck, PA         [1]   Allergies  Allergen Reactions    Cephalexin Shortness of breath and Nausea And Vomiting     Tolerated Cefazolin on 03/09/18 for GYN procedure    Ciprofloxacin Anaphylaxis, Shortness of breath and Rash     headache    Dextroamphetamine-Amphetamine Swelling     Patient reports swelling of gums. Symptom dissipated without treatment. Per patient 01/17/21: not allergic to medication, reaction to blue dye in pill    Prednisone Rash    Sulfur Headache   [2]   Past Medical History:  Diagnosis Date    Hypothyroidism     PTSD (post-traumatic stress disorder)    [3]   Past Surgical History:  Procedure Laterality Date    HAND SURGERY Right 08/30/2021    OTHER SURGICAL HISTORY      bone spurs    OTHER SURGICAL HISTORY  06/2020    excision calcium deposit left middle finger    RHINOPLASTY     [4]   Current Outpatient Medications   Medication Sig Dispense Refill    gabapentin  (Neurontin) 100 mg capsule Take 1 capsule (100 mg) by mouth in the morning, at noon, and at bedtime. 21 capsule 0    icosapent ethyL (Vascepa) 1 gram capsule Take 1 g by mouth with breakfast and with evening meal.      IMMUN GLOB G-SORB-GLY-IGA 0-50 IV Infuse into  a venous catheter every 14 (fourteen) days. Every 2 weeks infusion      multivitamin tablet Take 1 tablet by mouth in the morning.      THYROID ORAL Take 32 mg by mouth in the morning. Thyroid extract       No current facility-administered medications for this visit.

## 2023-08-07 NOTE — Telephone Encounter (Signed)
 Patient calling stating they had a small procedure on surface of skin (right hand, ring finger) from another provider, last Thursday; states pus is present and wound not closing/ healing properly; per patient, surgeon will not do corrective procedure and is currently treating with topical antibiotic. Patient states they sent pictures via portal on 5/7. Patient requesting call asap, as they can book flight for 11 AM today, if provider is able to see her today.     Please advise   [phone]

## 2023-08-11 ENCOUNTER — Ambulatory Visit: Payer: BLUE CROSS/BLUE SHIELD | Attending: Hand Surgery

## 2023-08-13 NOTE — Procedures (Signed)
 No outpatient medications have been marked as taking for the 08/21/23 encounter [facility] Encounter).            Medication Instructions  You may have to stop taking any of the following pain or anti-inflammatory drugs one week prior to your surgery:  aspirin, ibuprofen (Advil, Motrin), and naproxen (Naprosyn).  Please ask your surgeon about these medications if haven't already received directions.  You may take acetaminophen (Tylenol) for pain/discomfort.  Only take the following medications in the morning on the day of your surgery: thyroid medication  No vitamins or supplements one week preop.    Do's  DO bathe or shower the night before or the morning of surgery.    DO wear loose, comfortable clothing to the hospital on the day of surgery.  DO bring a case for your eye glasses, contact lenses or hearing aids.    Do Not's  DO NOT eat any solid foods after midnight the night before surgery.  You may drink CLEAR liquids (water, Gatorade, apple juice) up to 2 hours before your scheduled surgery time (NO milk, orange juice, smoothies, or anything cloudy).    DO NOT use breath mints or chew gum.   DO NOT use any deodorants, creams, colognes, powders, lotions, make-up, moisturizers, hair clips, barrettes or hair products (hair gel, hair spray or mousse).    DO NOT apply nail polish.  Please remove all existing nail polish.  It can affect the anesthesia monitoring equipment.   Please remove any/all piercings.   DO NOT shave near where you will have surgery for up to 2 days before the day of surgery.  Shaving can irritate the skin and can cause infection.  DO NOT bring valuables.  Leave all jewelry at home.  DO NOT plan to drive yourself home from the hospital.  You must arrange for transportation home by a responsible adult.    Other Instructions  Please check in at the Surgical Admission Check in - [facility] building, 5th floor.  Please call [phone]) to confirm your arrival time after 2pm, the day before your  surgery.

## 2023-08-26 LAB — HCG, URINE, QUALITATIVE: HCG Qualitative, Urine: NEGATIVE

## 2023-08-26 MED ORDER — lactated Ringer's infusion
INTRAVENOUS | Status: DC | PRN
Start: 2023-08-26 — End: 2023-08-26
  Administered 2023-08-26: 21:00:00 via INTRAVENOUS

## 2023-08-26 MED ORDER — ePHEDrine sulfate-0.9%NaCl(PF) 25 mg/5 mL (5 mg/mL) syringe
25 | INTRAVENOUS | Status: DC | PRN
Start: 2023-08-26 — End: 2023-08-26
  Administered 2023-08-26 (×3): 5 via INTRAVENOUS

## 2023-08-26 MED ORDER — lidocaine PF (Xylocaine) 20 mg/mL (2 %) injection  - Omnicell Override Pull
20 | INTRAMUSCULAR | Status: AC
Start: 2023-08-26 — End: ?

## 2023-08-26 MED ORDER — rocuronium (Zemuron) 50 mg/5 mL (10 mg/mL) injection  - Omnicell Override Pull
50 | INTRAVENOUS | Status: AC
Start: 2023-08-26 — End: ?

## 2023-08-26 MED ORDER — BUPivacaine-EPINEPHrine (PF) (Marcaine w/EPI) 0.25 %-1:200,000 injection  - Omnicell Override Pull
0.25 | INTRAMUSCULAR | Status: AC
Start: 2023-08-26 — End: ?

## 2023-08-26 MED ORDER — midazolam (Versed) injection
1 | INTRAMUSCULAR | Status: DC | PRN
Start: 2023-08-26 — End: 2023-08-26
  Administered 2023-08-26: 20:00:00 2 via INTRAVENOUS

## 2023-08-26 MED ORDER — bupivacaine PF (Marcaine) 0.5 % (5 mg/mL) injection
0.5 | Freq: Once | INTRAMUSCULAR | Status: AC | PRN
Start: 2023-08-26 — End: 2023-08-26
  Administered 2023-08-26: 20:00:00 10 via PERINEURAL

## 2023-08-26 MED ORDER — ceFAZolin (Ancef) injection
1 | INTRAMUSCULAR | Status: DC | PRN
Start: 2023-08-26 — End: 2023-08-26
  Administered 2023-08-26: 21:00:00 1 via INTRAVENOUS

## 2023-08-26 MED ORDER — sodium chloride 0.9 % irrigation solution
0.9 | Status: DC | PRN
Start: 2023-08-26 — End: 2023-08-26
  Administered 2023-08-26: 21:00:00 500

## 2023-08-26 MED ORDER — oxyCODONE (Roxicodone) 5 mg immediate release tablet
5 | ORAL_TABLET | Freq: Four times a day (QID) | ORAL | 0 refills | 8.00000 days | Status: AC | PRN
Start: 2023-08-26 — End: ?
  Filled 2023-08-26: qty 5, 2d supply, fill #0

## 2023-08-26 MED ORDER — propofol (Diprivan) 10 mg/mL injection  - Omnicell Override Pull
10 | INTRAVENOUS | Status: AC
Start: 2023-08-26 — End: ?

## 2023-08-26 MED ORDER — scopolamine (Transderm-Scop) patch 1 patch
1 | TRANSDERMAL | Status: DC
Start: 2023-08-26 — End: 2023-08-26
  Administered 2023-08-26: 17:00:00 1 via TRANSDERMAL

## 2023-08-26 MED ORDER — BUPivacaine-EPINEPHrine (PF) (Marcaine w/EPI) 0.25 %-1:200,000 injection
0.25 | INTRAMUSCULAR | Status: DC | PRN
Start: 2023-08-26 — End: 2023-08-26
  Administered 2023-08-26: 22:00:00 2

## 2023-08-26 MED ORDER — propofol (Diprivan) injection
10 | INTRAVENOUS | Status: DC | PRN
Start: 2023-08-26 — End: 2023-08-26
  Administered 2023-08-26: 21:00:00 150 via INTRAVENOUS
  Administered 2023-08-26: 21:00:00 50 via INTRAVENOUS

## 2023-08-26 MED ORDER — dexAMETHasone (Decadron) injection
4 | INTRAMUSCULAR | Status: DC | PRN
Start: 2023-08-26 — End: 2023-08-26
  Administered 2023-08-26: 21:00:00 4 via INTRAVENOUS

## 2023-08-26 MED ORDER — phenylephrine in NS (Neo-Synephrine) syringe
80 | INTRAVENOUS | Status: DC | PRN
Start: 2023-08-26 — End: 2023-08-26
  Administered 2023-08-26 (×7): 80 via INTRAVENOUS

## 2023-08-26 MED ORDER — ondansetron (Zofran) 4 mg/2 mL injection  - Omnicell Override Pull
4 | INTRAMUSCULAR | Status: AC
Start: 2023-08-26 — End: ?

## 2023-08-26 MED ORDER — acetaminophen (Tylenol) tablet 975 mg
325 | Freq: Once | ORAL | Status: DC | PRN
Start: 2023-08-26 — End: 2023-08-26

## 2023-08-26 MED ORDER — midazolam (Versed) 1 mg/mL injection  - Omnicell Override Pull
1 | INTRAMUSCULAR | Status: AC
Start: 2023-08-26 — End: ?

## 2023-08-26 MED ORDER — ondansetron (Zofran) injection
4 | INTRAMUSCULAR | Status: DC | PRN
Start: 2023-08-26 — End: 2023-08-26
  Administered 2023-08-26: 21:00:00 4 via INTRAVENOUS

## 2023-08-26 MED ORDER — ondansetron (Zofran) injection 4 mg
4 | INTRAMUSCULAR | Status: DC | PRN
Start: 2023-08-26 — End: 2023-08-26

## 2023-08-26 MED FILL — LIDOCAINE (PF) 20 MG/ML (2 %) INJECTION WRAPPER: 20 20 mg/mL (2 %) | INTRAMUSCULAR | Qty: 5 | Fill #0

## 2023-08-26 MED FILL — ONDANSETRON HCL (PF) 4 MG/2 ML INJECTION SOLUTION: 4 4 mg/2 mL | INTRAMUSCULAR | Qty: 2 | Fill #0

## 2023-08-26 MED FILL — SCOPOLAMINE 1 MG OVER 3 DAYS TRANSDERMAL PATCH: 1 1 mg over 3 days | TRANSDERMAL | Qty: 1 | Fill #0

## 2023-08-26 MED FILL — BUPIVACAINE-EPINEPHRINE (PF) 0.25 %-1:200,000 INJECTION SOLUTION: 0.25 0.25 %-1:200,000 | INTRAMUSCULAR | Qty: 30 | Fill #0

## 2023-08-26 MED FILL — ROCURONIUM 50 MG/5 ML (10 MG/ML) INTRAVENOUS SYRINGE: 50 50 mg/5 mL (10 mg/mL) | INTRAVENOUS | Qty: 5 | Fill #0

## 2023-08-26 MED FILL — PROPOFOL 10 MG/ML INTRAVENOUS EMULSION: 10 10 mg/mL | INTRAVENOUS | Qty: 20 | Fill #0

## 2023-08-26 MED FILL — MIDAZOLAM 1 MG/ML INJECTION SOLUTION WRAPPER: 1 1 mg/mL | INTRAMUSCULAR | Qty: 2 | Fill #0

## 2023-08-26 NOTE — Unmapped (Signed)
 Surgeon Attestation    I confirm that I will remain on hospital campus and am available for the planned procedure.     [name], MD  7:02 AM

## 2023-08-26 NOTE — Anesthesia Pre-Procedure Evaluation (Addendum)
 Patient: [name]    Procedure Information       Anesthesia Start Date/Time: 08/26/23 1631    Procedures:       Excision Deep Mass Right Shoulder, Ring + Small Finger (Right: Fingers)      Excision, Soft Tissue, Shoulder (Right: Shoulder)    Location: [facility] OR 20 / [facility] Operating Room    Surgeons: Linde Reveal, MD            Relevant Problems   Anesthesia   (+) PONV (postoperative nausea and vomiting)      Endo   (+) Hypothyroidism due to Hashimoto's thyroiditis       Neuro/Psych   (+) PTSD (post-traumatic stress disorder)        Clinical information reviewed:   Tobacco  Allergies  Meds   Med Hx  Surg Hx  OB Status  Fam Hx  Soc   Hx         Physical Exam    Airway  Mallampati: I  TM distance: >3 FB  Mouth opening: >3 FB  Neck ROM: full  Able to protrude mandible     Cardiovascular - normal exam     Dental    Pulmonary    Abdominal    General                  Anesthesia Plan    ASA 2   NPO status verified    MAC and regional     Airway: natural airway  Monitoring: standard monitors  Postoperative Pain Control: IV/PO analgesics and Peripheral Nerve Block (consent obtained)      Anesthetic plan and risks discussed with patient.  Use of blood products discussed with  Consented to blood products     Preprocedure Evaluation: No items outstanding.    Complex patient: NoNo Beta Blocker given in last 24 hours       Additional Equipment Requests

## 2023-08-26 NOTE — Brief Op Note (Signed)
 Date: 08/26/2023  Location: [facility] OR    Name: [name], DOB: 1985-05-20, MRN: [id number]    Diagnosis  * No Diagnosis Codes entered * * No Diagnosis Codes entered *     Procedures  Excision Deep Mass Right Shoulder, Ring + Small Finger  [phone] - PR EXC TUM/VAS MAL SFT TIS HAND/FNGR SUBFASC<1.5CM    Excision, Soft Tissue, Shoulder  11914 - PR EXC TUMOR SOFT TISS SHOULDER SUBFASC <5CM      Surgeons      * Linde Reveal - Primary    Procedure Summary  Anesthesia: General  ASA: II  Estimated Blood Loss: 2 mL  Total IV Fluids: 2 mL  Drains: * None in log *    Staff:   Circulator: Betti Browns, RN  Relief Scrub: Gareld June, RN  Scrub Person: Dozier Genre, RN    Indications: [name] is an 38 y.o. female who is having surgery for Calcinosis.      Findings: see op note    Complications:  None; patient tolerated the procedure well.     Disposition: PACU - hemodynamically stable.  Condition: stable  Specimens Collected:   Order Name Source Comment Collection Info Order Time   HCG, URINE, QUALITATIVE Urine  Collected By: Danielle Rolin, RN 08/26/2023 12:38 PM     Attending Attestation: I was present and scrubbed for the entire procedure.        Linde Reveal  Phone Number: [phone]

## 2023-08-26 NOTE — Anesthesia Post-Procedure Evaluation (Signed)
 Patient: [name]    Procedure Summary       Date: 08/26/23 Room / Location: [facility] OR 20 / [facility] Operating Room    Anesthesia Start: 1631 Anesthesia Stop: 1828    Procedures:       Excision Deep Mass Right Shoulder, Ring + Small Finger (Right: Fingers)      Excision, Soft Tissue, Shoulder (Right: Shoulder) Diagnosis: (Calcinosis)    Surgeons: Linde Reveal, MD Responsible Provider: Stann Earnest. Sternlicht, MD    Anesthesia Type: MAC, regional ASA Status: 2            Anesthesia Type: MAC, regional    Vitals Value Taken Time   BP 95/70 08/26/23 18:21   Temp 36.7c 08/26/23 18:28   Pulse 97 08/26/23 18:27   Resp 19 08/26/23 18:27   SpO2 99 % 08/26/23 18:27   Vitals shown include unfiled device data.    Anesthesia Post Evaluation Note:    Patient location during evaluation: PACU  Patient participation: able to participate  Level of consciousness: arousable  Cardiovascular and Hydration status: stable and vital signs within acceptable range  Respiratory Status Stable and Airway Patent: yes  Nausea and Vomiting Control Satisfactory: yes  Pain score: 0  Pain management: adequate     Aldrete score reviewed: yes  Vitals reviewed: yes  Unplanned ICU Admission: noPatient has recovered from anesthesia and has returned to baseline mental status, cardiovascular and respiratory function. Pain, nausea, and vomiting are adequately controlled and the patient is adequately hydrated and appropriate for discharge from PACU?: yes      There were no known notable events for this encounter.

## 2023-08-26 NOTE — Anesthesia Procedure Notes (Signed)
 Peripheral Block    Date/Time: 08/26/2023 4:00 PM    Patient Location:  Pre-op  Reason for Block: at surgeon's request and post-op pain management    Staff:     Anesthesiologist:  Erin Havers, MD    Resident/CRNA:  Estephan Gallardo, MD    Performed by:  Anesthesiologist and resident/CRNA  patient identified, anesthesia consent, monitors and equipment checked, pre-op evaluation and timeout performed    Peripheral Nerve Block:     Patient Position:  Sitting    Prep: ChloraPrep      Monitoring:  Blood pressure, continuous pulse ox and cardiac monitor  Common: Interscalene    Upper Extremity: Interscalene    Laterality:  Right  Injection Technique:  Single-shot  Needle:     Needle Gauge:  25 G    Needle Length:  8 cm    Needle Localization:  Ultrasound guidance    Image is saved in permenant record: Yes    Medications:     1% lidocaine used to infiltrate the skin and subcutaneous tissue       bupivacaine PF (Marcaine) 0.5 % (5 mg/mL) injection - perineural injection   10 mL - 08/26/2023 4:00:00 PM  Assessment:     Injection Assessment:  Local visualized surrounding nerve on ultrasound, no paresthesia on injection, incremental injection and negative aspiration

## 2023-08-26 NOTE — Discharge Instructions (Addendum)
 Keep dressing clean, dry, in tact, don't get wet  Wrap in plastic before shower  Elevate to reduce swelling  Bruising and swelling are NORMAL  Ok to move fingers to prevent stiffness    Take motrin 600mg  and tylenol 650 every 6 hours, can alternate  Take oxyocodone if pain not controlled

## 2023-08-26 NOTE — Interval H&P Note (Signed)
 H&P reviewed. The patient was examined and there are no changes to the H&P.

## 2023-08-26 NOTE — Op Note (Signed)
 Excision Deep Mass Right Shoulder, Ring + Small Finger (R), Excision, Soft Tissue, Shoulder (R) Operative Note     Date: 08/26/2023  Location: [facility] OR    Name: [name], DOB: Dec 04, 1985, MRN: [id number]    Diagnosis  * Calcinosis right shoulder acromioclavicular joint  Calcinosis right index finger  Calcinosis right ring finger  Calcinosis right small finger * * No Diagnosis Codes entered *     Procedures  Excision Deep Mass Right Shoulder, Ring + Small Finger  [phone] - PR EXC TUM/VAS MAL SFT TIS HAND/FNGR SUBFASC<1.5CM [3 units]    Excision, Soft Tissue, Shoulder  [id number] - PR EXC TUMOR SOFT TISS SHOULDER SUBFASC <5CM      Surgeons      * Linde Reveal - Primary   Verneta Gone - Assistant  Vito Grippe - Assistant    Procedure Summary  Anesthesia: General  ASA: II  Estimated Blood Loss: 2 mL  Total IV Fluids: see anesthesia record mL  Drains: * None in log *    Staff:   Circulator: Betti Browns, RN  Relief Scrub: Gareld June, RN  Scrub Person: Dozier Genre, RN    Indications: [name] is an 38 y.o. female who is having surgery for Calcinosis.  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  Her most symptomatic ones right now are at the right shoulder acromioclavicular joint, the dorsum of the DIP joint of the right index finger, right small finger and ulnar aspect of the ring finger.  She had had a calcinosis excision at the outside hospital recently for the ring finger and developed postoperative infection which has resolved.  She would like to have me review this under fluoroscopy to see whether there is any residual calcinosis in the ring finger and addressed this.  Additionally, her shoulder x-rays show calcinosis around the shoulder joint in addition to the [facility] joint.  She underwent a CT scan which I reviewed in the holding area.  This demonstrates calcinosis around the and in involving the  subscapularis tendon as well as the infraspinatus.  She does have some shoulder pain.  However it is unclear whether this calcinosis is contributing to the pain.  I recommended that we address the most symptomatic 1 which is involving the acromioclavicular joint and leaving the others at this time.  At some point, we would consider an MRI.  Risks of surgery include not limited to infection, nerve damage, stiffness, recurrence, incomplete symptom relief were explained to the patient preoperatively wished to proceed.       Procedure Details:  The patient was seen in the preoperative area. The risks, benefits, complications, treatment options, non-operative alternatives, expected recovery and outcomes were discussed with the patient. The possibilities of reaction to medication, pulmonary aspiration, injury to surrounding structures, bleeding, recurrent infection, the need for additional procedures, failure to diagnose a condition, and creating a complication requiring transfusion or operation were discussed with the patient. The patient concurred with the proposed plan, giving informed consent.  The site of surgery was properly noted/marked if necessary per policy. The patient has been actively warmed in preoperative area. Preoperative antibiotics have been ordered and given within 1 hours of incision. Venous thrombosis prophylaxis have been ordered including bilateral sequential compression devices    Following adequate anesthesia, the patient placed in supine position.  Right shoulder and upper extremity were prepped and draped sterile fashion.  Shoulder was first addressed.  Skin was infiltrated with 0.25% Marcaine with epinephrine.  A saber incision was made overlying the [facility] joint of the right shoulder.  Careful dissection was carried through subcutaneous tissue.  Hemostasis was achieved with bipolar cautery.  An AC joint capsulotomy was made.  A large massive calcinosis was identified and was excised, measuring  1.5 x 1.5 cm.  Residual calcium debris was removed using a rongeur and curette.  The wound was irrigated copiously.  And stasis was achieved with electrocautery.  The [facility] joint capsulotomy was repaired with 2-0 Vicryl interrupted sutures.  Subcutaneous tissues approximated with 3-0 Vicryl interrupted suture and the skin was approximated 4-0 Monocryl running subcuticular suture.  Dermabond and a sterile dressing were applied.    Next, the right upper extremity is exsanguinated with an Esmarch and proximal forearm tourniquet elevated to 230 mmHg.  An oblique incision was made over the dorsum of the DIP joint of the right index finger overlying the calcinosis.  Careful dissection was carried through the subcutaneous tissue.  Hemostasis was achieved with bipolar cautery.  The calcinosis was identified and was intimate with the terminal tendon and base of the distal phalanx at the dorsal radial aspect.  The calcinosis was then carefully excised using a curette and measured approximately 6 x 6 mm.  Care was taken to protect the terminal tendon and germinal matrix.  The wound was irrigated copiously.    Next, an incision made over the DIP joint of the right small finger overlying calcinosis.  Careful dissection was carried through subcutaneous tissue.  Was stasis was achieved with bipolar cautery.  Calcinosis was identified and was intimate with the terminal tendon and dorsal radial distal phalanx.  The calcinosis was carefully excised using a curette with care to protect the terminal tendon insertion and germinal matrix.  The calcinosis measuring approximately 5 x 5 mm.    Next, the ring finger was examined fluoroscopically.  There was some residual calcinosis present at the ulnar aspect of the DIP joint.  Consequently, a longitudinal incision made through the pre-existing scar at the ulnar aspect of the DIP joint of the right ring finger.  Careful dissection was carried through the subcutaneous tissue.  Was stasis was  achieved with bipolar cautery.  The calcinosis was then excised using a curette with care to protect the ulnar collateral ligament.  The calcinosis measured approximately 4 x 4 mm.  All wounds were then irrigated copiously.  Hemostasis was achieved with pressure followed with bipolar cautery.  The skin was approximated 4-0 nylon interrupted suture.  Sterile dressings were applied.  Tourniquet was released and the fingers became pink immediately with good capillary refill.  Findings: calcinosis at multiple locations    Complications:  None; patient tolerated the procedure well.     Disposition: PACU - hemodynamically stable.  Condition: stable        Linde Reveal  Phone Number: [phone]

## 2023-08-27 NOTE — Telephone Encounter (Signed)
 Patient calling Dr Dorisann Garre to see if can come in to remove dressing as cutting off circulation to finger tips,   Surgery yesterday and had nerve block and still no feeling from 3 inches below shoulder     Please call back to advise [phone]

## 2023-08-28 NOTE — Telephone Encounter (Signed)
 Patient called to discuss when her follow-up appointment would be, and had a few postoperative questions.    Advised the patient to call clinic tomorrow morning to talk about follow-up appointment.  Patient would like to return to [place] .  Advised the patient that it would be safe to travel.  Patient also asked whether she has certain range of motion restrictions due to having absorbable stitches placed instead of nylons.  Told the patient that the sutures themselves should not limit her weightbearing or range of motion.

## 2023-08-28 NOTE — Telephone Encounter (Signed)
 Patient would like a call back from Dr. Dorisann Garre or the PA to discuss post op questions/concerns. [place] and wants to know if she can travel with stithces, post op care questions and if she needs to schedule a post op follow up appointment prior to leaving back to her state.    Best contact # [phone]    Thank you,

## 2023-08-29 NOTE — Telephone Encounter (Signed)
 Dr. Dorisann Garre will call the patient.    [name]

## 2023-08-29 NOTE — Telephone Encounter (Signed)
 Patient is returning missed call from admin regarding follow up. Looking to schedule prior to leaving for [place] .     Please reach out   [phone]    Thank you

## 2023-09-04 DIAGNOSIS — M79644 Pain in right finger(s): Secondary | ICD-10-CM

## 2023-09-06 NOTE — Telephone Encounter (Signed)
 Patient called since she states that since having surgery Thursday she feels like she has been having a bad reaction from anesthesia medication.  Patient states that she has been doing well from a pain perspective since surgery and only needing Tylenol once.    However, she states that she has been having stomach problems, night sweats, and feels like she is turning yellow.  She states she was unable to obtain the records of which is due to the medication she was given at the surgery center.    With the symptoms, I advised the patient to present to the ED immediately to get some labs checked and that they can treat her symptoms as well.  Patient agreed with the plan.  Patient present to her nearest ED.

## 2023-09-19 ENCOUNTER — Ambulatory Visit: Payer: BLUE CROSS/BLUE SHIELD | Attending: Surgical

## 2023-09-22 NOTE — Telephone Encounter (Signed)
 Department Name: ORTHO    Patient: [name]  MRN: [id number]  Agent: Sabrina Eward Hutchinson    [facility] Scheduling Message    Patient requesting call back from: Marcelo    In regards to: Post opt appointment.    Patient requesting a call back today to coordinate her travel to [facility] for her appointment.    Best call back number: [phone]

## 2024-01-09 ENCOUNTER — Ambulatory Visit: Payer: BLUE CROSS/BLUE SHIELD | Attending: Hand Surgery

## 2024-01-21 NOTE — Telephone Encounter (Signed)
 Department Name: Orthopedics    Patient: [name]  MRN: [id number]  Agent: Damien Aldrich    [facility] Scheduling Message    Patient requesting call back from: Admin    In regards to: Patient calling today to ask how she can retrieve the images that were taken by Dr. Deidre during her 3 surgeries. Patient has already spoken to the radiology department and the images are not being held there.    Best call back number: [phone]

## 2024-02-02 ENCOUNTER — Ambulatory Visit: Payer: BLUE CROSS/BLUE SHIELD | Attending: Hand Surgery

## 2024-04-20 NOTE — Telephone Encounter (Signed)
 Department Name: Ortho    Patient: [name]  MRN: [id number]  Agent: Jeoffrey Conroy    [facility] Scheduling Message    Patient requesting call back from: Admin    In regards to: Pt asked when is the best time to reach Dr. Deidre, she wants to make sure she calls at the right time. Dr. Deidre texted her and wanted to make sure when she called he was not in surgery    Best call back number: [phone]
